# Patient Record
Sex: Female | Born: 1975
Health system: Southern US, Community
[De-identification: ages and names within clinical notes are randomized; demographics above are authoritative.]

## PROBLEM LIST (undated history)

## (undated) DIAGNOSIS — J45909 Unspecified asthma, uncomplicated: Secondary | ICD-10-CM

## (undated) DIAGNOSIS — Z789 Other specified health status: Secondary | ICD-10-CM

## (undated) DIAGNOSIS — Z973 Presence of spectacles and contact lenses: Secondary | ICD-10-CM

## (undated) HISTORY — PX: WISDOM TOOTH EXTRACTION: SHX21

## (undated) HISTORY — PX: APPENDECTOMY: SHX54

## (undated) HISTORY — PX: ABDOMINAL HYSTERECTOMY: SHX81

---

## 2018-10-19 ENCOUNTER — Emergency Department (HOSPITAL_COMMUNITY): Payer: No Typology Code available for payment source

## 2018-10-19 ENCOUNTER — Other Ambulatory Visit: Payer: Self-pay

## 2018-10-19 ENCOUNTER — Inpatient Hospital Stay (HOSPITAL_COMMUNITY)
Admission: EM | Admit: 2018-10-19 | Discharge: 2018-10-23 | DRG: 493 | Disposition: A | Discharge status: Home Health | Payer: No Typology Code available for payment source | Attending: General Surgery | Admitting: General Surgery

## 2018-10-19 ENCOUNTER — Encounter (HOSPITAL_COMMUNITY): Payer: Self-pay | Admitting: Emergency Medicine

## 2018-10-19 DIAGNOSIS — Z90710 Acquired absence of both cervix and uterus: Secondary | ICD-10-CM

## 2018-10-19 DIAGNOSIS — S2241XA Multiple fractures of ribs, right side, initial encounter for closed fracture: Secondary | ICD-10-CM | POA: Diagnosis present

## 2018-10-19 DIAGNOSIS — R509 Fever, unspecified: Secondary | ICD-10-CM | POA: Diagnosis not present

## 2018-10-19 DIAGNOSIS — Y92410 Unspecified street and highway as the place of occurrence of the external cause: Secondary | ICD-10-CM | POA: Diagnosis not present

## 2018-10-19 DIAGNOSIS — R7989 Other specified abnormal findings of blood chemistry: Secondary | ICD-10-CM

## 2018-10-19 DIAGNOSIS — S32119A Unspecified Zone I fracture of sacrum, initial encounter for closed fracture: Secondary | ICD-10-CM | POA: Diagnosis present

## 2018-10-19 DIAGNOSIS — S42111A Displaced fracture of body of scapula, right shoulder, initial encounter for closed fracture: Secondary | ICD-10-CM | POA: Diagnosis present

## 2018-10-19 DIAGNOSIS — S01511A Laceration without foreign body of lip, initial encounter: Secondary | ICD-10-CM | POA: Diagnosis present

## 2018-10-19 DIAGNOSIS — M542 Cervicalgia: Secondary | ICD-10-CM | POA: Diagnosis present

## 2018-10-19 DIAGNOSIS — Z723 Lack of physical exercise: Secondary | ICD-10-CM | POA: Diagnosis not present

## 2018-10-19 DIAGNOSIS — S3210XA Unspecified fracture of sacrum, initial encounter for closed fracture: Secondary | ICD-10-CM

## 2018-10-19 DIAGNOSIS — S82151A Displaced fracture of right tibial tuberosity, initial encounter for closed fracture: Secondary | ICD-10-CM | POA: Diagnosis present

## 2018-10-19 DIAGNOSIS — J45909 Unspecified asthma, uncomplicated: Secondary | ICD-10-CM | POA: Diagnosis present

## 2018-10-19 DIAGNOSIS — D62 Acute posthemorrhagic anemia: Secondary | ICD-10-CM | POA: Diagnosis not present

## 2018-10-19 DIAGNOSIS — S12600A Unspecified displaced fracture of seventh cervical vertebra, initial encounter for closed fracture: Secondary | ICD-10-CM | POA: Diagnosis present

## 2018-10-19 DIAGNOSIS — K8689 Other specified diseases of pancreas: Secondary | ICD-10-CM | POA: Diagnosis present

## 2018-10-19 DIAGNOSIS — Z1159 Encounter for screening for other viral diseases: Secondary | ICD-10-CM

## 2018-10-19 DIAGNOSIS — S82141A Displaced bicondylar fracture of right tibia, initial encounter for closed fracture: Secondary | ICD-10-CM | POA: Diagnosis present

## 2018-10-19 DIAGNOSIS — F17210 Nicotine dependence, cigarettes, uncomplicated: Secondary | ICD-10-CM | POA: Diagnosis present

## 2018-10-19 DIAGNOSIS — S12500A Unspecified displaced fracture of sixth cervical vertebra, initial encounter for closed fracture: Secondary | ICD-10-CM | POA: Diagnosis present

## 2018-10-19 DIAGNOSIS — S32009A Unspecified fracture of unspecified lumbar vertebra, initial encounter for closed fracture: Secondary | ICD-10-CM

## 2018-10-19 DIAGNOSIS — Z419 Encounter for procedure for purposes other than remedying health state, unspecified: Secondary | ICD-10-CM

## 2018-10-19 DIAGNOSIS — S42101A Fracture of unspecified part of scapula, right shoulder, initial encounter for closed fracture: Secondary | ICD-10-CM

## 2018-10-19 DIAGNOSIS — R413 Other amnesia: Secondary | ICD-10-CM | POA: Diagnosis present

## 2018-10-19 DIAGNOSIS — S82142A Displaced bicondylar fracture of left tibia, initial encounter for closed fracture: Secondary | ICD-10-CM

## 2018-10-19 DIAGNOSIS — F10920 Alcohol use, unspecified with intoxication, uncomplicated: Secondary | ICD-10-CM

## 2018-10-19 DIAGNOSIS — F10120 Alcohol abuse with intoxication, uncomplicated: Secondary | ICD-10-CM | POA: Diagnosis present

## 2018-10-19 DIAGNOSIS — S32059A Unspecified fracture of fifth lumbar vertebra, initial encounter for closed fracture: Secondary | ICD-10-CM | POA: Diagnosis present

## 2018-10-19 DIAGNOSIS — S82401A Unspecified fracture of shaft of right fibula, initial encounter for closed fracture: Secondary | ICD-10-CM | POA: Diagnosis present

## 2018-10-19 DIAGNOSIS — S129XXA Fracture of neck, unspecified, initial encounter: Secondary | ICD-10-CM

## 2018-10-19 HISTORY — DX: Other specified health status: Z78.9

## 2018-10-19 HISTORY — DX: Unspecified asthma, uncomplicated: J45.909

## 2018-10-19 LAB — ETHANOL: Alcohol, Ethyl (B): 119 mg/dL — ABNORMAL HIGH (ref <10)

## 2018-10-19 LAB — COMPREHENSIVE METABOLIC PANEL
ALT: 32 U/L (ref 0–44)
AST: 52 U/L — ABNORMAL HIGH (ref 15–41)
Albumin: 4 g/dL (ref 3.5–5.0)
Alkaline Phosphatase: 48 U/L (ref 38–126)
Anion gap: 11 (ref 5–15)
BUN: 9 mg/dL (ref 6–20)
CO2: 20 mmol/L — ABNORMAL LOW (ref 22–32)
Calcium: 8.7 mg/dL — ABNORMAL LOW (ref 8.9–10.3)
Chloride: 105 mmol/L (ref 98–111)
Creatinine, Ser: 0.83 mg/dL (ref 0.44–1.00)
GFR calc Af Amer: >60 mL/min (ref >60)
GFR calc non Af Amer: >60 mL/min (ref >60)
Glucose, Bld: 162 mg/dL — ABNORMAL HIGH (ref 70–99)
Potassium: 3.8 mmol/L (ref 3.5–5.1)
Sodium: 136 mmol/L (ref 135–145)
Total Bilirubin: 0.5 mg/dL (ref 0.3–1.2)
Total Protein: 7.6 g/dL (ref 6.5–8.1)

## 2018-10-19 LAB — I-STAT CHEM 8, ED
BUN: 11 mg/dL (ref 6–20)
Calcium, Ion: 1.05 mmol/L — ABNORMAL LOW (ref 1.15–1.40)
Chloride: 105 mmol/L (ref 98–111)
Creatinine, Ser: 1 mg/dL (ref 0.44–1.00)
Glucose, Bld: 158 mg/dL — ABNORMAL HIGH (ref 70–99)
HCT: 42 % (ref 36.0–46.0)
Hemoglobin: 14.3 g/dL (ref 12.0–15.0)
Potassium: 4.4 mmol/L (ref 3.5–5.1)
Sodium: 137 mmol/L (ref 135–145)
TCO2: 25 mmol/L (ref 22–32)

## 2018-10-19 LAB — URINALYSIS, ROUTINE W REFLEX MICROSCOPIC
Bilirubin Urine: NEGATIVE
Glucose, UA: NEGATIVE mg/dL
Ketones, ur: NEGATIVE mg/dL
Leukocytes,Ua: NEGATIVE
Nitrite: NEGATIVE
Protein, ur: NEGATIVE mg/dL
Specific Gravity, Urine: 1.038 — ABNORMAL HIGH (ref 1.005–1.030)
pH: 6 (ref 5.0–8.0)

## 2018-10-19 LAB — CBC
HCT: 42.4 % (ref 36.0–46.0)
Hemoglobin: 13.6 g/dL (ref 12.0–15.0)
MCH: 28.5 pg (ref 26.0–34.0)
MCHC: 32.1 g/dL (ref 30.0–36.0)
MCV: 88.9 fL (ref 80.0–100.0)
Platelets: 374 10*3/uL (ref 150–400)
RBC: 4.77 MIL/uL (ref 3.87–5.11)
RDW: 13.5 % (ref 11.5–15.5)
WBC: 11.5 10*3/uL — ABNORMAL HIGH (ref 4.0–10.5)
nRBC: 0 % (ref 0.0–0.2)

## 2018-10-19 LAB — PROTIME-INR
INR: 1.1 (ref 0.8–1.2)
Prothrombin Time: 13.6 seconds (ref 11.4–15.2)

## 2018-10-19 LAB — SAMPLE TO BLOOD BANK

## 2018-10-19 LAB — I-STAT BETA HCG BLOOD, ED (MC, WL, AP ONLY): I-stat hCG, quantitative: <5 m[IU]/mL (ref <5)

## 2018-10-19 LAB — LACTIC ACID, PLASMA
Lactic Acid, Venous: 2.7 mmol/L (ref 0.5–1.9)
Lactic Acid, Venous: 1.9 mmol/L (ref 0.5–1.9)

## 2018-10-19 LAB — SARS CORONAVIRUS 2 BY RT PCR (HOSPITAL ORDER, PERFORMED IN ~~LOC~~ HOSPITAL LAB): SARS Coronavirus 2: NEGATIVE

## 2018-10-19 MED ORDER — ONDANSETRON HCL 4 MG/2ML IJ SOLN
INTRAMUSCULAR | Status: AC
  Filled 2018-10-19: qty 2

## 2018-10-19 MED ORDER — ONDANSETRON HCL 4 MG/2ML IJ SOLN: 4.0000 mg | Freq: Four times a day (QID) | INTRAMUSCULAR | Status: DC | PRN

## 2018-10-19 MED ORDER — DOCUSATE SODIUM 100 MG PO CAPS
100.0000 mg | ORAL_CAPSULE | Freq: Two times a day (BID) | ORAL | Status: DC
  Administered 2018-10-19 – 2018-10-23 (×7): 100 mg via ORAL
  Filled 2018-10-19 (×8): qty 1

## 2018-10-19 MED ORDER — TETANUS-DIPHTH-ACELL PERTUSSIS 5-2.5-18.5 LF-MCG/0.5 IM SUSP
0.5000 mL | Freq: Once | INTRAMUSCULAR | Status: AC
  Administered 2018-10-19: 0.5 mL via INTRAMUSCULAR
  Filled 2018-10-19: qty 0.5

## 2018-10-19 MED ORDER — METHOCARBAMOL 1000 MG/10ML IJ SOLN
500.0000 mg | Freq: Four times a day (QID) | INTRAVENOUS | Status: DC | PRN
  Administered 2018-10-20 – 2018-10-21 (×2): 500 mg via INTRAVENOUS
  Filled 2018-10-19: qty 500
  Filled 2018-10-19 (×3): qty 5

## 2018-10-19 MED ORDER — HYDROMORPHONE HCL 1 MG/ML IJ SOLN
INTRAMUSCULAR | Status: AC
  Filled 2018-10-19: qty 1

## 2018-10-19 MED ORDER — HYDROMORPHONE HCL 1 MG/ML IJ SOLN
1.0000 mg | Freq: Once | INTRAMUSCULAR | Status: AC
  Administered 2018-10-19: 1 mg via INTRAVENOUS
  Filled 2018-10-19: qty 1

## 2018-10-19 MED ORDER — BISACODYL 10 MG RE SUPP: 10.0000 mg | Freq: Every day | RECTAL | Status: DC | PRN

## 2018-10-19 MED ORDER — SODIUM CHLORIDE 0.9 % IV SOLN
INTRAVENOUS | Status: DC
  Administered 2018-10-19 – 2018-10-22 (×7): via INTRAVENOUS

## 2018-10-19 MED ORDER — ONDANSETRON HCL 4 MG/2ML IJ SOLN
4.0000 mg | Freq: Once | INTRAMUSCULAR | Status: AC
  Administered 2018-10-19: 4 mg via INTRAVENOUS
  Filled 2018-10-19: qty 2

## 2018-10-19 MED ORDER — MORPHINE SULFATE (PF) 4 MG/ML IV SOLN
4.0000 mg | Freq: Once | INTRAVENOUS | Status: AC
  Administered 2018-10-19: 4 mg via INTRAVENOUS

## 2018-10-19 MED ORDER — OXYCODONE HCL 5 MG PO TABS
5.0000 mg | ORAL_TABLET | ORAL | Status: DC | PRN
  Filled 2018-10-19 (×2): qty 1

## 2018-10-19 MED ORDER — ONDANSETRON 4 MG PO TBDP: 4.0000 mg | ORAL_TABLET | Freq: Four times a day (QID) | ORAL | Status: DC | PRN

## 2018-10-19 MED ORDER — ENOXAPARIN SODIUM 40 MG/0.4ML ~~LOC~~ SOLN
40.0000 mg | SUBCUTANEOUS | Status: DC
  Administered 2018-10-19: 40 mg via SUBCUTANEOUS
  Filled 2018-10-19: qty 0.4

## 2018-10-19 MED ORDER — ONDANSETRON HCL 4 MG/2ML IJ SOLN
4.0000 mg | Freq: Once | INTRAMUSCULAR | Status: AC
  Administered 2018-10-19: 4 mg via INTRAVENOUS

## 2018-10-19 MED ORDER — MORPHINE SULFATE (PF) 4 MG/ML IV SOLN
INTRAVENOUS | Status: AC
  Filled 2018-10-19: qty 1

## 2018-10-19 MED ORDER — HYDROMORPHONE HCL 1 MG/ML IJ SOLN
1.0000 mg | Freq: Once | INTRAMUSCULAR | Status: AC
  Administered 2018-10-19: 1 mg via INTRAVENOUS

## 2018-10-19 MED ORDER — MORPHINE SULFATE (PF) 2 MG/ML IV SOLN
2.0000 mg | INTRAVENOUS | Status: DC | PRN
  Administered 2018-10-19 – 2018-10-22 (×12): 2 mg via INTRAVENOUS
  Filled 2018-10-19 (×14): qty 1

## 2018-10-19 MED ORDER — ACETAMINOPHEN 325 MG PO TABS
650.0000 mg | ORAL_TABLET | ORAL | Status: DC | PRN
  Administered 2018-10-19 – 2018-10-20 (×2): 650 mg via ORAL
  Filled 2018-10-19 (×2): qty 2

## 2018-10-19 MED ORDER — LIDOCAINE-EPINEPHRINE (PF) 2 %-1:200000 IJ SOLN
10.0000 mL | Freq: Once | INTRAMUSCULAR | Status: AC
  Administered 2018-10-19: 10 mL
  Filled 2018-10-19: qty 20

## 2018-10-19 MED ORDER — OXYCODONE HCL 5 MG PO TABS
10.0000 mg | ORAL_TABLET | ORAL | Status: DC | PRN
  Administered 2018-10-19 – 2018-10-21 (×6): 10 mg via ORAL
  Filled 2018-10-19 (×7): qty 2

## 2018-10-19 MED ORDER — IOHEXOL 300 MG/ML  SOLN
100.0000 mL | Freq: Once | INTRAMUSCULAR | Status: AC | PRN
  Administered 2018-10-19: 100 mL via INTRAVENOUS

## 2018-10-19 NOTE — ED Provider Notes (Signed)
..  Laceration Repair  Date/Time: 10/19/2018 8:08 AM Performed by: Renita Papa, PA-C Authorized by: Renita Papa, PA-C   Consent:    Consent obtained:  Verbal   Consent given by:  Patient   Risks discussed:  Infection, need for additional repair, pain, poor cosmetic result and poor wound healing   Alternatives discussed:  No treatment and delayed treatment Universal protocol:    Procedure explained and questions answered to patient or proxy's satisfaction: yes     Relevant documents present and verified: yes     Test results available and properly labeled: yes     Imaging studies available: yes     Required blood products, implants, devices, and special equipment available: yes     Site/side marked: yes     Immediately prior to procedure, a time out was called: yes     Patient identity confirmed:  Verbally with patient and arm band Anesthesia (see MAR for exact dosages):    Anesthesia method:  Local infiltration   Local anesthetic:  Lidocaine 2% WITH epi Laceration details:    Location:  Lip   Lip location:  Lower lip, full thickness   Vermilion border involved: no     Length (cm):  4 (4cm interiorly, 2cm exteriorly)   Depth (mm):  8 Repair type:    Repair type:  Intermediate Pre-procedure details:    Preparation:  Patient was prepped and draped in usual sterile fashion and imaging obtained to evaluate for foreign bodies Exploration:    Hemostasis achieved with:  Direct pressure   Wound exploration: wound explored through full range of motion and entire depth of wound probed and visualized     Wound extent: areolar tissue violated     Contaminated: yes   Treatment:    Area cleansed with:  Betadine, Hibiclens and saline   Amount of cleaning:  Extensive   Irrigation solution:  Sterile water   Irrigation method:  Syringe   Visualized foreign bodies/material removed: yes   Subcutaneous repair:    Suture size:  4-0   Suture material:  Vicryl (vicryl rapide)   Suture  technique:  Simple interrupted   Number of sutures:  2 Mucous membrane repair:    Suture size:  4-0   Suture material:  Vicryl (vicryl rapide)   Number of sutures:  5 Skin repair:    Repair method:  Sutures   Suture size:  5-0   Suture material:  Prolene   Suture technique:  Simple interrupted   Number of sutures:  3 Approximation:    Approximation:  Close   Vermilion border: well-aligned   Post-procedure details:    Dressing:  Open (no dressing)   Patient tolerance of procedure:  Tolerated well, no immediate complications      Debroah Baller 09/32/67 1245    Delora Fuel, MD 80/99/83 [phone]

## 2018-10-19 NOTE — Progress Notes (Signed)
Received patient from ED. Patient alert and oriented. Lower lip intact with sutures, RLE immobilizer in place, C-collar in place. Patient instructed to use call bell should she need anything. Will continue to monitor.
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
## 2018-10-19 NOTE — ED Notes (Signed)
Family out front wanting update

## 2018-10-19 NOTE — ED Notes (Signed)
X-ray at bedside
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
## 2018-10-19 NOTE — ED Notes (Signed)
Family Contacts:  (Husband) Scheryl Marten @ [phone] (Sister) [place] @ [phone] (Sister) [place] @ [phone] (Daughter) [place] @ [phone]
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
## 2018-10-19 NOTE — ED Notes (Signed)
Called and spoke with Husband updated on patient status and room number.
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
## 2018-10-20 LAB — BASIC METABOLIC PANEL
Anion gap: 10 (ref 5–15)
BUN: 6 mg/dL (ref 6–20)
CO2: 23 mmol/L (ref 22–32)
Calcium: 8.3 mg/dL — ABNORMAL LOW (ref 8.9–10.3)
Chloride: 104 mmol/L (ref 98–111)
Creatinine, Ser: 0.69 mg/dL (ref 0.44–1.00)
GFR calc Af Amer: >60 mL/min (ref >60)
GFR calc non Af Amer: >60 mL/min (ref >60)
Glucose, Bld: 122 mg/dL — ABNORMAL HIGH (ref 70–99)
Potassium: 3.4 mmol/L — ABNORMAL LOW (ref 3.5–5.1)
Sodium: 137 mmol/L (ref 135–145)

## 2018-10-20 LAB — CBC
HCT: 35.9 % — ABNORMAL LOW (ref 36.0–46.0)
Hemoglobin: 11.7 g/dL — ABNORMAL LOW (ref 12.0–15.0)
MCH: 27.9 pg (ref 26.0–34.0)
MCHC: 32.6 g/dL (ref 30.0–36.0)
MCV: 85.7 fL (ref 80.0–100.0)
Platelets: 316 10*3/uL (ref 150–400)
RBC: 4.19 MIL/uL (ref 3.87–5.11)
RDW: 13.5 % (ref 11.5–15.5)
WBC: 14.2 10*3/uL — ABNORMAL HIGH (ref 4.0–10.5)
nRBC: 0 % (ref 0.0–0.2)

## 2018-10-20 LAB — HIV ANTIBODY (ROUTINE TESTING W REFLEX): HIV Screen 4th Generation wRfx: NONREACTIVE

## 2018-10-20 NOTE — Progress Notes (Signed)
Ortho Trauma Progress Note  Reviewed imaging per Dr. Stann Mainland request. Significantly comminuted bicondylar tibial plateau fracture. Will attempt ORIF tomorrow pending swelling. Will evaluate patient in the AM. NPO past midnight. Will consult in the AM.  Shona Needles, MD [facility] [phone] (phone) [phone] (office) [email or web address]

## 2018-10-20 NOTE — Plan of Care (Signed)
  Problem: Education: Goal: Knowledge of General Education information will improve Description: Including pain rating scale, medication(s)/side effects and non-pharmacologic comfort measures Outcome: Progressing   Problem: Health Behavior/Discharge Planning: Goal: Ability to manage health-related needs will improve Outcome: Progressing   Problem: Clinical Measurements: Goal: Ability to maintain clinical measurements within normal limits will improve Outcome: Progressing Goal: Will remain free from infection Outcome: Progressing Goal: Cardiovascular complication will be avoided Outcome: Progressing   Problem: Nutrition: Goal: Adequate nutrition will be maintained Outcome: Progressing   Problem: Coping: Goal: Level of anxiety will decrease Outcome: Progressing   Problem: Elimination: Goal: Will not experience complications related to urinary retention Outcome: Progressing   Problem: Pain Managment: Goal: General experience of comfort will improve Outcome: Progressing   Problem: Skin Integrity: Goal: Risk for impaired skin integrity will decrease Outcome: Progressing
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
## 2018-10-20 NOTE — Progress Notes (Signed)
Wasted 10 mg of oxycodone with Netta Neat, RN after patient requested PRN morphine instead.

## 2018-10-21 ENCOUNTER — Inpatient Hospital Stay (HOSPITAL_COMMUNITY): Payer: No Typology Code available for payment source | Admitting: Anesthesiology

## 2018-10-21 ENCOUNTER — Encounter (HOSPITAL_COMMUNITY): Admission: EM | Disposition: A | Discharge status: Home Health | Payer: Self-pay | Source: Home / Self Care

## 2018-10-21 ENCOUNTER — Inpatient Hospital Stay (HOSPITAL_COMMUNITY): Payer: No Typology Code available for payment source

## 2018-10-21 ENCOUNTER — Encounter (HOSPITAL_COMMUNITY): Payer: Self-pay | Admitting: Anesthesiology

## 2018-10-21 HISTORY — PX: ORIF TIBIA PLATEAU: SHX2132

## 2018-10-21 LAB — CBC
HCT: 35.7 % — ABNORMAL LOW (ref 36.0–46.0)
Hemoglobin: 11.7 g/dL — ABNORMAL LOW (ref 12.0–15.0)
MCH: 28 pg (ref 26.0–34.0)
MCHC: 32.8 g/dL (ref 30.0–36.0)
MCV: 85.4 fL (ref 80.0–100.0)
Platelets: 290 10*3/uL (ref 150–400)
RBC: 4.18 MIL/uL (ref 3.87–5.11)
RDW: 13.2 % (ref 11.5–15.5)
WBC: 11.5 10*3/uL — ABNORMAL HIGH (ref 4.0–10.5)
nRBC: 0 % (ref 0.0–0.2)

## 2018-10-21 LAB — BASIC METABOLIC PANEL
Anion gap: 10 (ref 5–15)
BUN: 5 mg/dL — ABNORMAL LOW (ref 6–20)
CO2: 24 mmol/L (ref 22–32)
Calcium: 8.7 mg/dL — ABNORMAL LOW (ref 8.9–10.3)
Chloride: 103 mmol/L (ref 98–111)
Creatinine, Ser: 0.67 mg/dL (ref 0.44–1.00)
GFR calc Af Amer: >60 mL/min (ref >60)
GFR calc non Af Amer: >60 mL/min (ref >60)
Glucose, Bld: 108 mg/dL — ABNORMAL HIGH (ref 70–99)
Potassium: 3.6 mmol/L (ref 3.5–5.1)
Sodium: 137 mmol/L (ref 135–145)

## 2018-10-21 LAB — MRSA PCR SCREENING: MRSA by PCR: NEGATIVE

## 2018-10-21 SURGERY — OPEN REDUCTION INTERNAL FIXATION (ORIF) TIBIAL PLATEAU
Anesthesia: General | Site: Knee | Laterality: Right

## 2018-10-21 MED ORDER — VANCOMYCIN HCL 1000 MG IV SOLR
INTRAVENOUS | Status: DC | PRN
  Administered 2018-10-21: 1000 mg

## 2018-10-21 MED ORDER — OXYCODONE HCL 5 MG PO TABS
5.0000 mg | ORAL_TABLET | ORAL | Status: DC | PRN
  Administered 2018-10-21 – 2018-10-22 (×3): 10 mg via ORAL
  Filled 2018-10-21 (×3): qty 2

## 2018-10-21 MED ORDER — VANCOMYCIN HCL 1000 MG IV SOLR
INTRAVENOUS | Status: AC
  Filled 2018-10-21: qty 1000

## 2018-10-21 MED ORDER — HYDROMORPHONE HCL 1 MG/ML IJ SOLN
INTRAMUSCULAR | Status: AC
  Filled 2018-10-21: qty 0.5

## 2018-10-21 MED ORDER — ONDANSETRON HCL 4 MG/2ML IJ SOLN
INTRAMUSCULAR | Status: DC | PRN
  Administered 2018-10-21: 4 mg via INTRAVENOUS

## 2018-10-21 MED ORDER — ONDANSETRON HCL 4 MG/2ML IJ SOLN: 4.0000 mg | Freq: Once | INTRAMUSCULAR | Status: DC | PRN

## 2018-10-21 MED ORDER — DEXAMETHASONE SODIUM PHOSPHATE 4 MG/ML IJ SOLN
INTRAMUSCULAR | Status: DC | PRN
  Administered 2018-10-21: 10 mg via INTRAVENOUS

## 2018-10-21 MED ORDER — SUCCINYLCHOLINE CHLORIDE 200 MG/10ML IV SOSY
PREFILLED_SYRINGE | INTRAVENOUS | Status: AC
  Filled 2018-10-21: qty 10

## 2018-10-21 MED ORDER — MIDAZOLAM HCL 5 MG/5ML IJ SOLN
INTRAMUSCULAR | Status: DC | PRN
  Administered 2018-10-21: 2 mg via INTRAVENOUS

## 2018-10-21 MED ORDER — FENTANYL CITRATE (PF) 100 MCG/2ML IJ SOLN
INTRAMUSCULAR | Status: DC | PRN
  Administered 2018-10-21 (×2): 50 ug via INTRAVENOUS
  Administered 2018-10-21: 100 ug via INTRAVENOUS
  Administered 2018-10-21: 50 ug via INTRAVENOUS

## 2018-10-21 MED ORDER — GLYCOPYRROLATE 0.2 MG/ML IJ SOLN
INTRAMUSCULAR | Status: DC | PRN
  Administered 2018-10-21: 0.2 mg via INTRAVENOUS

## 2018-10-21 MED ORDER — SUGAMMADEX SODIUM 200 MG/2ML IV SOLN
INTRAVENOUS | Status: DC | PRN
  Administered 2018-10-21: 200 mg via INTRAVENOUS

## 2018-10-21 MED ORDER — 0.9 % SODIUM CHLORIDE (POUR BTL) OPTIME
TOPICAL | Status: DC | PRN
  Administered 2018-10-21: 1000 mL

## 2018-10-21 MED ORDER — FENTANYL CITRATE (PF) 250 MCG/5ML IJ SOLN
INTRAMUSCULAR | Status: AC
  Filled 2018-10-21: qty 5

## 2018-10-21 MED ORDER — OXYCODONE HCL 5 MG/5ML PO SOLN: 5.0000 mg | Freq: Once | ORAL | Status: DC | PRN

## 2018-10-21 MED ORDER — HYDROMORPHONE HCL 1 MG/ML IJ SOLN
0.2500 mg | INTRAMUSCULAR | Status: DC | PRN
  Administered 2018-10-21: 0.5 mg via INTRAVENOUS

## 2018-10-21 MED ORDER — OXYCODONE HCL 5 MG PO TABS: 5.0000 mg | ORAL_TABLET | Freq: Once | ORAL | Status: DC | PRN

## 2018-10-21 MED ORDER — ROCURONIUM BROMIDE 10 MG/ML (PF) SYRINGE
PREFILLED_SYRINGE | INTRAVENOUS | Status: DC | PRN
  Administered 2018-10-21: 50 mg via INTRAVENOUS
  Administered 2018-10-21: 20 mg via INTRAVENOUS

## 2018-10-21 MED ORDER — HYDROMORPHONE HCL 1 MG/ML IJ SOLN
INTRAMUSCULAR | Status: AC
  Filled 2018-10-21: qty 1

## 2018-10-21 MED ORDER — LACTATED RINGERS IV SOLN
INTRAVENOUS | Status: DC
  Administered 2018-10-21 (×2): via INTRAVENOUS

## 2018-10-21 MED ORDER — LIDOCAINE 2% (20 MG/ML) 5 ML SYRINGE
INTRAMUSCULAR | Status: AC
  Filled 2018-10-21: qty 5

## 2018-10-21 MED ORDER — ENOXAPARIN SODIUM 40 MG/0.4ML ~~LOC~~ SOLN
40.0000 mg | SUBCUTANEOUS | Status: DC
  Administered 2018-10-22 – 2018-10-23 (×2): 40 mg via SUBCUTANEOUS
  Filled 2018-10-21 (×2): qty 0.4

## 2018-10-21 MED ORDER — CEFAZOLIN SODIUM-DEXTROSE 2-4 GM/100ML-% IV SOLN
2.0000 g | Freq: Three times a day (TID) | INTRAVENOUS | Status: AC
  Administered 2018-10-21 – 2018-10-22 (×3): 2 g via INTRAVENOUS
  Filled 2018-10-21 (×3): qty 100

## 2018-10-21 MED ORDER — LIDOCAINE 2% (20 MG/ML) 5 ML SYRINGE
INTRAMUSCULAR | Status: DC | PRN
  Administered 2018-10-21: 60 mg via INTRAVENOUS

## 2018-10-21 MED ORDER — PROPOFOL 10 MG/ML IV BOLUS
INTRAVENOUS | Status: DC | PRN
  Administered 2018-10-21: 200 mg via INTRAVENOUS

## 2018-10-21 MED ORDER — ACETAMINOPHEN 325 MG PO TABS
650.0000 mg | ORAL_TABLET | Freq: Four times a day (QID) | ORAL | Status: DC
  Administered 2018-10-21 – 2018-10-23 (×7): 650 mg via ORAL
  Filled 2018-10-21 (×8): qty 2

## 2018-10-21 MED ORDER — PROPOFOL 10 MG/ML IV BOLUS
INTRAVENOUS | Status: AC
  Filled 2018-10-21: qty 20

## 2018-10-21 MED ORDER — DEXMEDETOMIDINE HCL IN NACL 400 MCG/100ML IV SOLN: INTRAVENOUS | Status: DC | PRN

## 2018-10-21 MED ORDER — METHOCARBAMOL 750 MG PO TABS
750.0000 mg | ORAL_TABLET | Freq: Three times a day (TID) | ORAL | Status: DC
  Administered 2018-10-21 – 2018-10-23 (×6): 750 mg via ORAL
  Filled 2018-10-21 (×6): qty 1

## 2018-10-21 MED ORDER — MIDAZOLAM HCL 2 MG/2ML IJ SOLN
INTRAMUSCULAR | Status: AC
  Filled 2018-10-21: qty 2

## 2018-10-21 MED ORDER — SUCCINYLCHOLINE CHLORIDE 20 MG/ML IJ SOLN
INTRAMUSCULAR | Status: DC | PRN
  Administered 2018-10-21: 100 mg via INTRAVENOUS

## 2018-10-21 MED ORDER — DEXMEDETOMIDINE HCL 200 MCG/2ML IV SOLN
INTRAVENOUS | Status: DC | PRN
  Administered 2018-10-21: 8 ug via INTRAVENOUS
  Administered 2018-10-21: 12 ug via INTRAVENOUS
  Administered 2018-10-21: 8 ug via INTRAVENOUS

## 2018-10-21 MED ORDER — CEFAZOLIN SODIUM-DEXTROSE 2-3 GM-%(50ML) IV SOLR
INTRAVENOUS | Status: DC | PRN
  Administered 2018-10-21: 2 g via INTRAVENOUS

## 2018-10-21 MED ORDER — HYDROMORPHONE HCL 1 MG/ML IJ SOLN
INTRAMUSCULAR | Status: DC | PRN
  Administered 2018-10-21: 0.5 mg via INTRAVENOUS

## 2018-10-21 SURGICAL SUPPLY — 90 items
BANDAGE ACE 4X5 VEL STRL LF (GAUZE/BANDAGES/DRESSINGS) ×3 IMPLANT
BANDAGE ACE 6X5 VEL STRL LF (GAUZE/BANDAGES/DRESSINGS) ×3 IMPLANT
BANDAGE ELASTIC 4 VELCRO ST LF (GAUZE/BANDAGES/DRESSINGS) ×2 IMPLANT
BANDAGE ELASTIC 6 VELCRO ST LF (GAUZE/BANDAGES/DRESSINGS) ×2 IMPLANT
BANDAGE ESMARK 6X9 LF (GAUZE/BANDAGES/DRESSINGS) ×1 IMPLANT
BENZOIN TINCTURE PRP APPL 2/3 (GAUZE/BANDAGES/DRESSINGS) ×6 IMPLANT
BIT DRILL 2.5 X LONG (BIT) ×1
BIT DRILL CALIBR QC 2.8X250 (BIT) ×3 IMPLANT
BIT DRILL QC 3.5X110 (BIT) ×3 IMPLANT
BIT DRILL X LONG 2.5 (BIT) ×1 IMPLANT
BLADE CLIPPER SURG (BLADE) IMPLANT
BLADE SURG 15 STRL LF DISP TIS (BLADE) ×1 IMPLANT
BLADE SURG 15 STRL SS (BLADE) ×2
BNDG ESMARK 6X9 LF (GAUZE/BANDAGES/DRESSINGS) ×3
BNDG GAUZE ELAST 4 BULKY (GAUZE/BANDAGES/DRESSINGS) ×3 IMPLANT
BRUSH SCRUB SURG 4.25 DISP (MISCELLANEOUS) ×6 IMPLANT
CANISTER SUCT 3000ML PPV (MISCELLANEOUS) ×3 IMPLANT
CHLORAPREP W/TINT 26 (MISCELLANEOUS) ×6 IMPLANT
CLOSURE STERI-STRIP 1/2X4 (GAUZE/BANDAGES/DRESSINGS) ×3
CLSR STERI-STRIP ANTIMIC 1/2X4 (GAUZE/BANDAGES/DRESSINGS) ×6 IMPLANT
COVER SURGICAL LIGHT HANDLE (MISCELLANEOUS) ×3 IMPLANT
COVER WAND RF STERILE (DRAPES) ×3 IMPLANT
CUFF TOURN SGL QUICK 34 (TOURNIQUET CUFF) ×2
CUFF TRNQT CYL 34X4.125X (TOURNIQUET CUFF) ×1 IMPLANT
DRAPE C-ARM 42X72 X-RAY (DRAPES) ×3 IMPLANT
DRAPE C-ARMOR (DRAPES) ×3 IMPLANT
DRAPE ORTHO SPLIT 77X108 STRL (DRAPES) ×4
DRAPE SURG ORHT 6 SPLT 77X108 (DRAPES) ×2 IMPLANT
DRAPE U-SHAPE 47X51 STRL (DRAPES) ×3 IMPLANT
DRILL BIT X LONG 2.5 (BIT) ×2
DRSG MEPILEX BORDER 4X4 (GAUZE/BANDAGES/DRESSINGS) ×3 IMPLANT
DRSG PAD ABDOMINAL 8X10 ST (GAUZE/BANDAGES/DRESSINGS) ×6 IMPLANT
ELECT REM PT RETURN 9FT ADLT (ELECTROSURGICAL) ×3
ELECTRODE REM PT RTRN 9FT ADLT (ELECTROSURGICAL) ×1 IMPLANT
GAUZE SPONGE 4X4 12PLY STRL (GAUZE/BANDAGES/DRESSINGS) ×3 IMPLANT
GAUZE SPONGE 4X4 12PLY STRL LF (GAUZE/BANDAGES/DRESSINGS) ×3 IMPLANT
GLOVE BIO SURGEON STRL SZ 6.5 (GLOVE) ×10 IMPLANT
GLOVE BIO SURGEON STRL SZ7.5 (GLOVE) ×15 IMPLANT
GLOVE BIO SURGEONS STRL SZ 6.5 (GLOVE) ×5
GLOVE BIOGEL PI IND STRL 6.5 (GLOVE) ×1 IMPLANT
GLOVE BIOGEL PI IND STRL 7.5 (GLOVE) ×1 IMPLANT
GLOVE BIOGEL PI INDICATOR 6.5 (GLOVE) ×2
GLOVE BIOGEL PI INDICATOR 7.5 (GLOVE) ×2
GOWN STRL REUS W/ TWL LRG LVL3 (GOWN DISPOSABLE) ×5 IMPLANT
GOWN STRL REUS W/TWL LRG LVL3 (GOWN DISPOSABLE) ×10
IMMOBILIZER KNEE 22 UNIV (SOFTGOODS) ×3 IMPLANT
KIT BASIN OR (CUSTOM PROCEDURE TRAY) ×3 IMPLANT
KIT TURNOVER KIT B (KITS) ×3 IMPLANT
NDL SUT 6 .5 CRC .975X.05 MAYO (NEEDLE) ×1 IMPLANT
NEEDLE MAYO TAPER (NEEDLE) ×2
NS IRRIG 1000ML POUR BTL (IV SOLUTION) ×3 IMPLANT
PACK TOTAL JOINT (CUSTOM PROCEDURE TRAY) ×3 IMPLANT
PAD ABD 8X10 STRL (GAUZE/BANDAGES/DRESSINGS) ×3 IMPLANT
PAD ARMBOARD 7.5X6 YLW CONV (MISCELLANEOUS) ×6 IMPLANT
PAD CAST 4YDX4 CTTN HI CHSV (CAST SUPPLIES) ×1 IMPLANT
PADDING CAST COTTON 4X4 STRL (CAST SUPPLIES) ×2
PADDING CAST COTTON 6X4 STRL (CAST SUPPLIES) ×3 IMPLANT
PLATE 4H RT 3.5MM MED PROX TIB (Plate) ×3 IMPLANT
PLATE TIBIA VA-LCP 6H RT (Plate) ×3 IMPLANT
SCREW CORTEX 3.5 32MM (Screw) ×4 IMPLANT
SCREW CORTEX 3.5 34MM (Screw) ×2 IMPLANT
SCREW CORTEX 3.5 40MM (Screw) ×2 IMPLANT
SCREW CORTEX 3.5 55MM (Screw) ×6 IMPLANT
SCREW HEADED ST 3.5X55 (Screw) ×6 IMPLANT
SCREW HEADED ST 3.5X70 (Screw) ×6 IMPLANT
SCREW LOCK CORT ST 3.5X32 (Screw) ×2 IMPLANT
SCREW LOCK CORT ST 3.5X34 (Screw) ×1 IMPLANT
SCREW LOCK CORT ST 3.5X40 (Screw) ×1 IMPLANT
SCREW LOCKING 3.5X70MM VA (Screw) ×3 IMPLANT
SCREW LOCKING VA 3.5X50MM (Screw) ×6 IMPLANT
SCREW LOCKING VA 3.5X75MM (Screw) ×6 IMPLANT
STAPLER VISISTAT 35W (STAPLE) ×3 IMPLANT
SUCTION FRAZIER HANDLE 10FR (MISCELLANEOUS) ×2
SUCTION TUBE FRAZIER 10FR DISP (MISCELLANEOUS) ×1 IMPLANT
SUT ETHILON 2 0 FS 18 (SUTURE) ×3 IMPLANT
SUT ETHILON 3 0 PS 1 (SUTURE) IMPLANT
SUT FIBERWIRE #2 38 T-5 BLUE (SUTURE)
SUT MNCRL AB 3-0 PS2 18 (SUTURE) ×6 IMPLANT
SUT VIC AB 0 CT1 27 (SUTURE) ×4
SUT VIC AB 0 CT1 27XBRD ANBCTR (SUTURE) ×2 IMPLANT
SUT VIC AB 1 CT1 18XCR BRD 8 (SUTURE) ×2 IMPLANT
SUT VIC AB 1 CT1 27 (SUTURE) ×2
SUT VIC AB 1 CT1 27XBRD ANBCTR (SUTURE) ×1 IMPLANT
SUT VIC AB 1 CT1 8-18 (SUTURE) ×4
SUT VIC AB 2-0 CT1 27 (SUTURE) ×6
SUT VIC AB 2-0 CT1 TAPERPNT 27 (SUTURE) ×3 IMPLANT
SUTURE FIBERWR #2 38 T-5 BLUE (SUTURE) IMPLANT
TOWEL GREEN STERILE (TOWEL DISPOSABLE) ×6 IMPLANT
TRAY FOLEY MTR SLVR 16FR STAT (SET/KITS/TRAYS/PACK) IMPLANT
WATER STERILE IRR 1000ML POUR (IV SOLUTION) ×6 IMPLANT

## 2018-10-21 NOTE — Anesthesia Postprocedure Evaluation (Signed)
Anesthesia Post Note  Patient: Deneka Greenwalt  Procedure(s) Performed: OPEN REDUCTION INTERNAL FIXATION (ORIF) TIBIAL PLATEAU (Right Knee)     Patient location during evaluation: PACU Anesthesia Type: General Level of consciousness: awake and alert Pain management: pain level controlled Vital Signs Assessment: post-procedure vital signs reviewed and stable Respiratory status: spontaneous breathing, nonlabored ventilation, respiratory function stable and patient connected to nasal cannula oxygen Cardiovascular status: blood pressure returned to baseline and stable Postop Assessment: no apparent nausea or vomiting Anesthetic complications: no    Last Vitals:  Vitals:   10/21/18 1420 10/21/18 1852  BP: (!) 143/87 135/83  Pulse: 90 82  Resp: 18 18  Temp: 36.6 C 37.1 C  SpO2: 93% 99%    Last Pain:  Vitals:   10/21/18 1852  TempSrc: Oral  PainSc:                  Karyl Kinnier Braidan Ricciardi
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
## 2018-10-21 NOTE — Plan of Care (Signed)
  Problem: Clinical Measurements: Goal: Respiratory complications will improve Outcome: Progressing   Problem: Activity: Goal: Risk for activity intolerance will decrease Outcome: Progressing   Problem: Elimination: Goal: Will not experience complications related to bowel motility Outcome: Progressing Goal: Will not experience complications related to urinary retention Outcome: Progressing   Problem: Pain Managment: Goal: General experience of comfort will improve Outcome: Progressing   Problem: Safety: Goal: Ability to remain free from injury will improve Outcome: Progressing   Problem: Skin Integrity: Goal: Risk for impaired skin integrity will decrease Outcome: Progressing

## 2018-10-21 NOTE — Transfer of Care (Signed)
Immediate Anesthesia Transfer of Care Note  Patient: Tina Gallegos  Procedure(s) Performed: OPEN REDUCTION INTERNAL FIXATION (ORIF) TIBIAL PLATEAU (Right Knee)  Patient Location: PACU  Anesthesia Type:General  Level of Consciousness: awake  Airway & Oxygen Therapy: Patient Spontanous Breathing and Patient connected to face mask oxygen  Post-op Assessment: Report given to RN and Post -op Vital signs reviewed and stable  Post vital signs: Reviewed and stable  Last Vitals:  Vitals Value Taken Time  BP 119/72 10/21/18 1323  Temp 36.4 C 10/21/18 1323  Pulse 80 10/21/18 1326  Resp 18 10/21/18 1326  SpO2 100 % 10/21/18 1326  Vitals shown include unvalidated device data.  Last Pain:  Vitals:   10/21/18 1323  TempSrc:   PainSc: (P) Asleep      Patients Stated Pain Goal: 3 (24/09/73 5329)  Complications: No apparent anesthesia complications
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
## 2018-10-21 NOTE — Anesthesia Procedure Notes (Signed)
Procedure Name: Intubation Date/Time: 10/21/2018 10:29 AM Performed by: Lieutenant Diego, CRNA Pre-anesthesia Checklist: Patient identified, Emergency Drugs available, Suction available and Patient being monitored Patient Re-evaluated:Patient Re-evaluated prior to induction Oxygen Delivery Method: Circle system utilized Preoxygenation: Pre-oxygenation with 100% oxygen Induction Type: IV induction Ventilation: Mask ventilation without difficulty Laryngoscope Size: Glidescope (elective) Grade View: Grade I Tube type: Oral Tube size: 7.0 mm Number of attempts: 1 Airway Equipment and Method: Stylet and Oral airway Placement Confirmation: ETT inserted through vocal cords under direct vision,  positive ETCO2 and breath sounds checked- equal and bilateral Secured at: 23 cm Tube secured with: Tape Dental Injury: Teeth and Oropharynx as per pre-operative assessment  Difficulty Due To: Difficult Airway- due to limited oral opening

## 2018-10-21 NOTE — Progress Notes (Signed)
Orthopedic Tech Progress Note Patient Details:  Tina Gallegos October 03, 1975 [id number] Called [employer] for knee brace. Patient ID: Tina Gallegos, female   DOB: 01/31/1976, 43 y.o.   MRN: [id number]   Tina Gallegos T 10/21/2018, 2:22 PM
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
## 2018-10-22 LAB — BASIC METABOLIC PANEL
Anion gap: 10 (ref 5–15)
BUN: 5 mg/dL — ABNORMAL LOW (ref 6–20)
CO2: 23 mmol/L (ref 22–32)
Calcium: 8.6 mg/dL — ABNORMAL LOW (ref 8.9–10.3)
Chloride: 104 mmol/L (ref 98–111)
Creatinine, Ser: 0.69 mg/dL (ref 0.44–1.00)
GFR calc Af Amer: >60 mL/min (ref >60)
GFR calc non Af Amer: >60 mL/min (ref >60)
Glucose, Bld: 119 mg/dL — ABNORMAL HIGH (ref 70–99)
Potassium: 3.6 mmol/L (ref 3.5–5.1)
Sodium: 137 mmol/L (ref 135–145)

## 2018-10-22 LAB — CBC
HCT: 31.2 % — ABNORMAL LOW (ref 36.0–46.0)
Hemoglobin: 10.4 g/dL — ABNORMAL LOW (ref 12.0–15.0)
MCH: 28.3 pg (ref 26.0–34.0)
MCHC: 33.3 g/dL (ref 30.0–36.0)
MCV: 85 fL (ref 80.0–100.0)
Platelets: 308 10*3/uL (ref 150–400)
RBC: 3.67 MIL/uL — ABNORMAL LOW (ref 3.87–5.11)
RDW: 13.2 % (ref 11.5–15.5)
WBC: 15.3 10*3/uL — ABNORMAL HIGH (ref 4.0–10.5)
nRBC: 0 % (ref 0.0–0.2)

## 2018-10-22 LAB — VITAMIN D 25 HYDROXY (VIT D DEFICIENCY, FRACTURES): Vit D, 25-Hydroxy: 16.2 ng/mL — ABNORMAL LOW (ref 30.0–100.0)

## 2018-10-22 MED ORDER — MORPHINE SULFATE (PF) 2 MG/ML IV SOLN
2.0000 mg | INTRAVENOUS | Status: DC | PRN
  Administered 2018-10-22 – 2018-10-23 (×2): 2 mg via INTRAVENOUS
  Filled 2018-10-22 (×2): qty 1

## 2018-10-22 MED ORDER — VITAMIN D 25 MCG (1000 UNIT) PO TABS
2000.0000 [IU] | ORAL_TABLET | Freq: Two times a day (BID) | ORAL | Status: DC
  Administered 2018-10-22 – 2018-10-23 (×3): 2000 [IU] via ORAL
  Filled 2018-10-22 (×3): qty 2

## 2018-10-22 MED ORDER — OXYCODONE HCL 5 MG PO TABS
10.0000 mg | ORAL_TABLET | ORAL | Status: DC | PRN
  Administered 2018-10-22 (×3): 15 mg via ORAL
  Filled 2018-10-22 (×4): qty 3

## 2018-10-22 MED ORDER — WHITE PETROLATUM EX OINT
TOPICAL_OINTMENT | CUTANEOUS | Status: AC
  Administered 2018-10-22: 20:00:00
  Filled 2018-10-22: qty 28.35
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
## 2018-10-22 NOTE — Discharge Instructions (Addendum)
Orthopaedic Trauma Service Discharge Instructions   General Discharge Instructions  WEIGHT BEARING STATUS: Non-weightbearing on right leg  RANGE OF MOTION/ACTIVITY: No restrictions with knee range of motion while in the hinge knee brace. Lock hinge brace into full extension at night  Wound Care: Can leave incisions open to air daily. Leave steri-strips on incisions, they will fall off on their own.   DVT/PE prophylaxis: Aspirin 325 mg daily  Diet: as you were eating previously.  Can use over the counter stool softeners and bowel preparations, such as Miralax, to help with bowel movements.  Narcotics can be constipating.  Be sure to drink plenty of fluids  PAIN MEDICATION USE AND EXPECTATIONS  You have likely been given narcotic medications to help control your pain.  After a traumatic event that results in an fracture (broken bone) with or without surgery, it is ok to use narcotic pain medications to help control one's pain.  We understand that everyone responds to pain differently and each individual patient will be evaluated on a regular basis for the continued need for narcotic medications. Ideally, narcotic medication use should last no more than 6-8 weeks (coinciding with fracture healing).   As a patient it is your responsibility as well to monitor narcotic medication use and report the amount and frequency you use these medications when you come to your office visit.   We would also advise that if you are using narcotic medications, you should take a dose prior to therapy to maximize you participation.  IF YOU ARE ON NARCOTIC MEDICATIONS IT IS NOT PERMISSIBLE TO OPERATE A MOTOR VEHICLE (MOTORCYCLE/CAR/TRUCK/MOPED) OR HEAVY MACHINERY DO NOT MIX NARCOTICS WITH OTHER CNS (CENTRAL NERVOUS SYSTEM) DEPRESSANTS SUCH AS ALCOHOL   STOP SMOKING OR USING NICOTINE PRODUCTS!!!!  As discussed nicotine severely impairs your body's ability to heal surgical and traumatic wounds but also impairs  bone healing.  Wounds and bone heal by forming microscopic blood vessels (angiogenesis) and nicotine is a vasoconstrictor (essentially, shrinks blood vessels).  Therefore, if vasoconstriction occurs to these microscopic blood vessels they essentially disappear and are unable to deliver necessary nutrients to the healing tissue.  This is one modifiable factor that you can do to dramatically increase your chances of healing your injury.    (This means no smoking, no nicotine gum, patches, etc)  DO NOT USE NONSTEROIDAL ANTI-INFLAMMATORY DRUGS (NSAID'S)  Using products such as Advil (ibuprofen), Aleve (naproxen), Motrin (ibuprofen) for additional pain control during fracture healing can delay and/or prevent the healing response.  If you would like to take over the counter (OTC) medication, Tylenol (acetaminophen) is ok.  However, some narcotic medications that are given for pain control contain acetaminophen as well. Therefore, you should not exceed more than 4000 mg of tylenol in a day if you do not have liver disease.  Also note that there are may OTC medicines, such as cold medicines and allergy medicines that my contain tylenol as well.  If you have any questions about medications and/or interactions please ask your doctor/PA or your pharmacist.      ICE AND ELEVATE INJURED/OPERATIVE EXTREMITY  Using ice and elevating the injured extremity above your heart can help with swelling and pain control.  Icing in a pulsatile fashion, such as 20 minutes on and 20 minutes off, can be followed.    Do not place ice directly on skin. Make sure there is a barrier between to skin and the ice pack.    Using frozen items such as frozen  peas works well as the conform nicely to the are that needs to be iced.  USE AN ACE WRAP OR TED HOSE FOR SWELLING CONTROL  In addition to icing and elevation, Ace wraps or TED hose are used to help limit and resolve swelling.  It is recommended to use Ace wraps or TED hose until you are  informed to stop.    When using Ace Wraps start the wrapping distally (farthest away from the body) and wrap proximally (closer to the body)   Example: If you had surgery on your leg or thing and you do not have a splint on, start the ace wrap at the toes and work your way up to the thigh        If you had surgery on your upper extremity and do not have a splint on, start the ace wrap at your fingers and work your way up to the upper arm   CALL THE OFFICE WITH ANY QUESTIONS OR CONCERNS: [phone]   VISIT OUR WEBSITE FOR ADDITIONAL INFORMATION: [email or web address]     Discharge Wound Care Instructions  Do NOT apply any ointments, solutions or lotions to pin sites or surgical wounds.  These prevent needed drainage and even though solutions like hydrogen peroxide kill bacteria, they also damage cells lining the pin sites that help fight infection.  Applying lotions or ointments can keep the wounds moist and can cause them to breakdown and open up as well. This can increase the risk for infection. When in doubt call the office.  Surgical incisions should be dressed daily.  If any drainage is noted, use one layer of adaptic, then gauze, Kerlix, and an ace wrap.  Once the incision is completely dry and without drainage, it may be left open to air out.  Showering may begin 36-48 hours later.  Cleaning gently with soap and water.  Traumatic wounds should be dressed daily as well.    One layer of adaptic, gauze, Kerlix, then ace wrap.  The adaptic can be discontinued once the draining has ceased    If you have a wet to dry dressing: wet the gauze with saline the squeeze as much saline out so the gauze is moist (not soaking wet), place moistened gauze over wound, then place a dry gauze over the moist one, followed by Kerlix wrap, then ace wrap.    Rib Fracture  A rib fracture is a break or crack in one of the bones of the ribs. The ribs are like a cage that goes around your upper chest. A  broken or cracked rib is often painful, but most do not cause other problems. Most rib fractures usually heal on their own in 1-3 months. Follow these instructions at home: Managing pain, stiffness, and swelling  If directed, apply ice to the injured area. ? Put ice in a plastic bag. ? Place a towel between your skin and the bag. ? Leave the ice on for 20 minutes, 2-3 times a day.  Take over-the-counter and prescription medicines only as told by your doctor. Activity  Avoid activities that cause pain to the injured area. Protect your injured area.  Slowly increase activity as told by your doctor. General instructions  Do deep breathing as told by your doctor. You may be told to: ? Take deep breaths many times a day. ? Cough many times a day while hugging a pillow. ? Use a device (incentive spirometer) to do deep breathing many times a day.  Drink enough fluid  to keep your pee (urine) clear or pale yellow.  Do not wear a rib belt or binder. These do not allow you to breathe deeply.  Keep all follow-up visits as told by your doctor. This is important. Contact a doctor if:  You have a fever. Get help right away if:  You have trouble breathing.  You are short of breath.  You cannot stop coughing.  You cough up thick or bloody spit (sputum).  You feel sick to your stomach (nauseous), throw up (vomit), or have belly (abdominal) pain.  Your pain gets worse and medicine does not help. Summary  A rib fracture is a break or crack in one of the bones of the ribs.  Apply ice to the injured area and take medicines for pain as told by your doctor.  Take deep breaths and cough many times a day. Hug a pillow every time you cough. This information is not intended to replace advice given to you by your health care provider. Make sure you discuss any questions you have with your health care provider. Document Released: 01/11/2008 Document Revised: 03/16/2017 Document Reviewed:  07/04/2016 Elsevier Patient Education  2020 [employer]  You should contact your insurance and find out what primary care providers in your area are covered. You need to establish care with someone both to assist with acute management but also for surveillance of possible sarcoid seen on chest CT and dilated pancreatic duct.

## 2018-10-23 ENCOUNTER — Encounter (HOSPITAL_COMMUNITY): Payer: Self-pay | Admitting: Student

## 2018-10-23 LAB — CBC
HCT: 29.3 % — ABNORMAL LOW (ref 36.0–46.0)
Hemoglobin: 9.7 g/dL — ABNORMAL LOW (ref 12.0–15.0)
MCH: 28.1 pg (ref 26.0–34.0)
MCHC: 33.1 g/dL (ref 30.0–36.0)
MCV: 84.9 fL (ref 80.0–100.0)
Platelets: 335 10*3/uL (ref 150–400)
RBC: 3.45 MIL/uL — ABNORMAL LOW (ref 3.87–5.11)
RDW: 13.4 % (ref 11.5–15.5)
WBC: 10.7 10*3/uL — ABNORMAL HIGH (ref 4.0–10.5)
nRBC: 0 % (ref 0.0–0.2)

## 2018-10-23 LAB — BASIC METABOLIC PANEL
Anion gap: 10 (ref 5–15)
BUN: 6 mg/dL (ref 6–20)
CO2: 25 mmol/L (ref 22–32)
Calcium: 8.2 mg/dL — ABNORMAL LOW (ref 8.9–10.3)
Chloride: 101 mmol/L (ref 98–111)
Creatinine, Ser: 0.69 mg/dL (ref 0.44–1.00)
GFR calc Af Amer: >60 mL/min (ref >60)
GFR calc non Af Amer: >60 mL/min (ref >60)
Glucose, Bld: 112 mg/dL — ABNORMAL HIGH (ref 70–99)
Potassium: 3 mmol/L — ABNORMAL LOW (ref 3.5–5.1)
Sodium: 136 mmol/L (ref 135–145)

## 2018-10-23 MED ORDER — VITAMIN D 25 MCG (1000 UNIT) PO TABS: 2000.0000 [IU] | ORAL_TABLET | Freq: Two times a day (BID) | ORAL | 1 refills | Status: DC

## 2018-10-23 MED ORDER — DOCUSATE SODIUM 100 MG PO CAPS: 100.0000 mg | ORAL_CAPSULE | Freq: Two times a day (BID) | ORAL | Status: DC

## 2018-10-23 MED ORDER — OXYCODONE HCL 10 MG PO TABS: 10.0000 mg | ORAL_TABLET | ORAL | 0 refills | Status: DC | PRN

## 2018-10-23 MED ORDER — TRAMADOL HCL 50 MG PO TABS
50.0000 mg | ORAL_TABLET | Freq: Four times a day (QID) | ORAL | Status: DC
  Administered 2018-10-23 (×2): 50 mg via ORAL
  Filled 2018-10-23 (×2): qty 1

## 2018-10-23 MED ORDER — ASPIRIN EC 325 MG PO TBEC: 325.0000 mg | DELAYED_RELEASE_TABLET | Freq: Every day | ORAL | 0 refills | Status: AC

## 2018-10-23 MED ORDER — ACETAMINOPHEN 500 MG PO TABS: 1000.0000 mg | ORAL_TABLET | Freq: Four times a day (QID) | ORAL | 0 refills | Status: DC | PRN

## 2018-10-23 MED ORDER — BISACODYL 10 MG RE SUPP: 10.0000 mg | Freq: Every day | RECTAL | 0 refills | Status: DC | PRN

## 2018-10-23 MED ORDER — ACETAMINOPHEN 500 MG PO TABS
1000.0000 mg | ORAL_TABLET | Freq: Four times a day (QID) | ORAL | Status: DC
  Administered 2018-10-23 (×2): 1000 mg via ORAL
  Filled 2018-10-23 (×2): qty 2

## 2018-10-23 MED ORDER — METHOCARBAMOL 750 MG PO TABS: 750.0000 mg | ORAL_TABLET | Freq: Three times a day (TID) | ORAL | 0 refills | Status: DC

## 2018-10-23 MED ORDER — POTASSIUM CHLORIDE CRYS ER 20 MEQ PO TBCR
40.0000 meq | EXTENDED_RELEASE_TABLET | Freq: Once | ORAL | Status: AC
  Administered 2018-10-23: 40 meq via ORAL
  Filled 2018-10-23: qty 2

## 2018-10-23 MED ORDER — TRAMADOL HCL 50 MG PO TABS: 50.0000 mg | ORAL_TABLET | Freq: Four times a day (QID) | ORAL | 0 refills | Status: DC

## 2018-10-23 MED FILL — ASPIRIN EC 325 MG TABLET: 325 | 60 days supply | Qty: 60 | Fill #0

## 2018-10-23 MED FILL — VITAMIN D3 1,000 UNIT TAB: 25 MCG | 30 days supply | Qty: 120 | Fill #0

## 2018-10-23 MED FILL — traMADol HCL 50 MG TABS: 50 | 7 days supply | Qty: 30 | Fill #0

## 2018-10-23 MED FILL — oxyCODONE HCL 10 MG TABS: 10 | 5 days supply | Qty: 45 | Fill #0

## 2018-10-23 MED FILL — METHOCARBAMOL 750 MG TABS: 750 | 20 days supply | Qty: 60 | Fill #0

## 2018-10-23 NOTE — Progress Notes (Signed)
Chaplain responded to spiritual consult. Patient may be interested in Advanced Directive.  Patient not familiar with AD when chaplain visited.  Chaplain explained the two parts and left forms with patient for further consideration.  Chaplain will be available if patient wishes to complete and sign forms. Rev. Tamsen Snider Pager [phone]
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
## 2018-10-23 NOTE — Progress Notes (Signed)
CSW met with patient to complete SBIRT- patient voiced feeling excited to get home. Patient voiced no current concerns with any alcohol or substance abuse at this time. Patient did state she likes to drink on occasion however did not think this was an issue. CSW voiced understanding and encouraged patient to reach out to [facility] department/ PCP with any questions or concerns.   Kingsley Spittle, LCSW [facility]  [phone]
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
## 2018-10-23 NOTE — Progress Notes (Signed)
Patient suffers from a tibial plateau fracture which impairs their ability to perform daily activities like bathing, dressing, feeding and grooming in the home.  A walker will not resolve issue with performing activities of daily living. A wheelchair will allow patient to safely perform daily activities. Patient can safely propel the wheelchair in the home or has a caregiver who can provide assistance. Length of need 6 months . Accessories: elevating leg rests (ELRs), wheel locks, extensions and anti-tippers.  Brigid Re , [facility] 10/23/2018, 7:35 AM Pager: [phone]
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
## 2018-10-23 NOTE — Progress Notes (Signed)
Discharged Pt to home. Instructions given and explained. Belongings returned accordingly. 

## 2018-10-30 ENCOUNTER — Encounter: Payer: Self-pay | Admitting: Student

## 2018-10-30 DIAGNOSIS — S329XXA Fracture of unspecified parts of lumbosacral spine and pelvis, initial encounter for closed fracture: Secondary | ICD-10-CM | POA: Insufficient documentation

## 2018-10-30 DIAGNOSIS — S2249XA Multiple fractures of ribs, unspecified side, initial encounter for closed fracture: Secondary | ICD-10-CM | POA: Insufficient documentation

## 2018-10-30 DIAGNOSIS — S82141A Displaced bicondylar fracture of right tibia, initial encounter for closed fracture: Secondary | ICD-10-CM | POA: Insufficient documentation

## 2018-10-30 DIAGNOSIS — S82151A Displaced fracture of right tibial tuberosity, initial encounter for closed fracture: Secondary | ICD-10-CM | POA: Insufficient documentation

## 2018-10-30 DIAGNOSIS — S12500A Unspecified displaced fracture of sixth cervical vertebra, initial encounter for closed fracture: Secondary | ICD-10-CM | POA: Insufficient documentation

## 2018-10-30 DIAGNOSIS — S42101A Fracture of unspecified part of scapula, right shoulder, initial encounter for closed fracture: Secondary | ICD-10-CM | POA: Insufficient documentation

## 2018-12-24 ENCOUNTER — Other Ambulatory Visit (HOSPITAL_COMMUNITY)
Admission: RE | Admit: 2018-12-24 | Discharge: 2018-12-24 | Disposition: A | Discharge status: Home / Self Care | Payer: HRSA Program | Source: Ambulatory Visit | Attending: Student | Admitting: Student

## 2018-12-24 ENCOUNTER — Ambulatory Visit: Payer: Self-pay | Admitting: Student

## 2018-12-24 DIAGNOSIS — S82141A Displaced bicondylar fracture of right tibia, initial encounter for closed fracture: Secondary | ICD-10-CM

## 2018-12-24 DIAGNOSIS — Z20828 Contact with and (suspected) exposure to other viral communicable diseases: Secondary | ICD-10-CM | POA: Diagnosis present

## 2018-12-24 DIAGNOSIS — Z01812 Encounter for preprocedural laboratory examination: Secondary | ICD-10-CM | POA: Insufficient documentation

## 2018-12-25 ENCOUNTER — Other Ambulatory Visit: Payer: Self-pay

## 2018-12-25 ENCOUNTER — Encounter (HOSPITAL_COMMUNITY): Payer: Self-pay | Admitting: *Deleted

## 2018-12-25 LAB — NOVEL CORONAVIRUS, NAA (HOSP ORDER, SEND-OUT TO REF LAB; TAT 18-24 HRS): SARS-CoV-2, NAA: NOT DETECTED

## 2018-12-25 NOTE — Progress Notes (Signed)
Pt denies SOB, chest pain, and being under the care of a cardiologist. PT denies having a PCP. Pt denies having a stress test, echo and cardiac cath. Pt denies having a chest x ray in the last year.  Pt denies recent labs. Pt made aware to stop taking  Aspirin (unless otherwise advised by surgeon), vitamins, fish oil and herbal medications. Do not take any NSAIDs ie: Ibuprofen, Advil, Naproxen (Aleve), Motrin, BC and Goody Powder. Pt verbalized understanding of all pre-op instructions.

## 2018-12-26 NOTE — H&P (Signed)
Orthopaedic Trauma Service (OTS) H&P  Patient ID: Tina Gallegos MRN: [id number] DOB/AGE: Sep 27, 1975 43 y.o.  Reason for Surgery: Closed manipulation of right knee   HPI: Tina CoppMonica Gallegos is an 43 y.o. female presenting for surgery. Patient underwent ORIF of right bicondylar tibial plateau fracture 10/21/2018. She has gone on to heal her fracture over the last 2 months but has moved her knee very little in the meantime. Due to lack of insurance has not done any formal physical therapy. She has been provided range of motion exercises to perform on her own at home.  She has been ambulating some at home with the use of a walker but has remained non-weightbearing on the right leg. Continues to have pain in the knee with motion. She denies numbness or tingling in the extremity. Denies issues with her incision. She has 70-75 degrees of knee flexion and has a few degrees of flexion contracture. She would like to move forward with manipulation of the knee at this point.  Past Medical History:  Diagnosis Date  . Asthma   . Known health problems: none   . Wears glasses     Past Surgical History:  Procedure Laterality Date  . ABDOMINAL HYSTERECTOMY    . APPENDECTOMY    . ORIF TIBIA PLATEAU Right 10/21/2018   Procedure: OPEN REDUCTION INTERNAL FIXATION (ORIF) TIBIAL PLATEAU;  Surgeon: Roby LoftsHaddix, Kevin P, MD;  Location: [facility] OR;  Service: Orthopedics;  Laterality: Right;  . WISDOM TOOTH EXTRACTION      Family History  Problem Relation Age of Onset  . Hypertension Mother     Social History:  reports that she has been smoking cigars. She has never used smokeless tobacco. She reports current alcohol use. She reports current drug use. Drug: Marijuana.  Allergies:  Allergies  Allergen Reactions  . Percocet [Oxycodone-Acetaminophen] Hives and Rash    Broke out in vaginal area and nails. Pt tolerates just plain oxycodone    Medications: Current Meds  Medication Sig  . acetaminophen (TYLENOL) 500 MG  tablet Take 2 tablets (1,000 mg total) by mouth every 6 (six) hours as needed for mild pain or fever.     ROS: Constitutional: No fever or chills Vision: No changes in vision ENT: No difficulty swallowing CV: No chest pain Pulm: No SOB or wheezing GI: No nausea or vomiting GU: No urgency or inability to hold urine Skin: No poor wound healing Neurologic: No numbness or tingling Psychiatric: No depression or anxiety Heme: No bruising Allergic: No reaction to medications or food   Exam: There were no vitals taken for this visit. General: Sitting up, NAD Orientation: Alert and oriented x 3 Mood and Affect: Mood and affect appropriate, pleasant and cooperative Gait: Ambulates with walker, unable to WB on RLE Coordination and balance: Within normal limits  Right Lower Extremity: Well healed incision. Minimally tender with palpation of knee. About 5 degree flexion contracture. Active knee flexion about 70 degrees. With some manipulation in clinic, able to get about 75 degrees of flexion. motor and sensory function intact distally. 2+ DP pulse   Left Lower Extremity: Skin without lesions. No tenderness to palpation. Full painless ROM, full strength in each muscle group without evidence of instability.   Medical Decision Making: Data: Imaging: AP and lateral views of right knee and tibia/fibula shows plate in screws in excellent position without signs of hardware failure or loosening. Fracture appears to be almost fully healed  Labs: No results found for this or any previous visit (from the  past 24 hour(s)).   Medical history and chart was reviewed and case discussed with medical provider.  Assessment/Plan: 43 year old female presenting for closed manipulation of right knee s/p ORIF right tibial plateau fracture 10/21/2018.  At this juncture, I do not feel that her motion will improve much more without a formal manipulation of the knee under anesthesia. I would recommend proceeding  with the planned procedure. I discussed risks and benefits of procedure with the patient. Risks discussed included continued pain, stiffness, post-traumatic arthritis, and even anesthesia complications. She agrees to proceed with surgery. Questions answered, consent obtained.    Saketh Daubert A. Carmie Kanner [facility] ?[phone]? (phone)

## 2018-12-27 ENCOUNTER — Encounter (HOSPITAL_COMMUNITY)
Admission: RE | Disposition: A | Discharge status: Home / Self Care | Payer: Self-pay | Source: Home / Self Care | Attending: Student

## 2018-12-27 ENCOUNTER — Other Ambulatory Visit: Payer: Self-pay

## 2018-12-27 ENCOUNTER — Ambulatory Visit (HOSPITAL_COMMUNITY): Payer: Self-pay

## 2018-12-27 ENCOUNTER — Ambulatory Visit (HOSPITAL_COMMUNITY): Payer: Self-pay | Admitting: Certified Registered Nurse Anesthetist

## 2018-12-27 ENCOUNTER — Encounter (HOSPITAL_COMMUNITY): Payer: Self-pay

## 2018-12-27 ENCOUNTER — Ambulatory Visit (HOSPITAL_COMMUNITY)
Admission: RE | Admit: 2018-12-27 | Discharge: 2018-12-27 | Disposition: A | Discharge status: Home / Self Care | Payer: Self-pay | Attending: Student | Admitting: Student

## 2018-12-27 DIAGNOSIS — F17290 Nicotine dependence, other tobacco product, uncomplicated: Secondary | ICD-10-CM | POA: Insufficient documentation

## 2018-12-27 DIAGNOSIS — Z419 Encounter for procedure for purposes other than remedying health state, unspecified: Secondary | ICD-10-CM

## 2018-12-27 DIAGNOSIS — M24661 Ankylosis, right knee: Secondary | ICD-10-CM | POA: Insufficient documentation

## 2018-12-27 DIAGNOSIS — S82142A Displaced bicondylar fracture of left tibia, initial encounter for closed fracture: Secondary | ICD-10-CM

## 2018-12-27 DIAGNOSIS — S82141A Displaced bicondylar fracture of right tibia, initial encounter for closed fracture: Secondary | ICD-10-CM

## 2018-12-27 HISTORY — DX: Presence of spectacles and contact lenses: Z97.3

## 2018-12-27 HISTORY — PX: KNEE CLOSED REDUCTION: SHX995

## 2018-12-27 LAB — HEMOGLOBIN: Hemoglobin: 13.4 g/dL (ref 12.0–15.0)

## 2018-12-27 SURGERY — MANIPULATION, KNEE, CLOSED
Anesthesia: General | Site: Knee | Laterality: Right

## 2018-12-27 MED ORDER — LIDOCAINE 2% (20 MG/ML) 5 ML SYRINGE
INTRAMUSCULAR | Status: AC
  Filled 2018-12-27: qty 5

## 2018-12-27 MED ORDER — EPHEDRINE 5 MG/ML INJ
INTRAVENOUS | Status: AC
  Filled 2018-12-27: qty 10

## 2018-12-27 MED ORDER — MIDAZOLAM HCL 5 MG/5ML IJ SOLN
INTRAMUSCULAR | Status: DC | PRN
  Administered 2018-12-27: 2 mg via INTRAVENOUS

## 2018-12-27 MED ORDER — HYDROMORPHONE HCL 1 MG/ML IJ SOLN
0.2500 mg | INTRAMUSCULAR | Status: DC | PRN
  Administered 2018-12-27: 1 mg via INTRAVENOUS
  Administered 2018-12-27 (×2): 0.5 mg via INTRAVENOUS

## 2018-12-27 MED ORDER — HYDROCODONE-ACETAMINOPHEN 5-325 MG PO TABS
ORAL_TABLET | ORAL | Status: AC
  Filled 2018-12-27: qty 1

## 2018-12-27 MED ORDER — PROPOFOL 10 MG/ML IV BOLUS
INTRAVENOUS | Status: DC | PRN
  Administered 2018-12-27: 200 mg via INTRAVENOUS

## 2018-12-27 MED ORDER — LACTATED RINGERS IV SOLN
INTRAVENOUS | Status: DC | PRN
  Administered 2018-12-27: 07:00:00 via INTRAVENOUS

## 2018-12-27 MED ORDER — HYDROCODONE-ACETAMINOPHEN 5-325 MG PO TABS: 1.0000 | ORAL_TABLET | Freq: Four times a day (QID) | ORAL | 0 refills | Status: AC | PRN

## 2018-12-27 MED ORDER — LIDOCAINE 2% (20 MG/ML) 5 ML SYRINGE
INTRAMUSCULAR | Status: DC | PRN
  Administered 2018-12-27: 80 mg via INTRAVENOUS

## 2018-12-27 MED ORDER — HYDROCODONE-ACETAMINOPHEN 5-325 MG PO TABS
1.0000 | ORAL_TABLET | ORAL | Status: AC
  Administered 2018-12-27: 1 via ORAL

## 2018-12-27 MED ORDER — PROPOFOL 10 MG/ML IV BOLUS
INTRAVENOUS | Status: AC
  Filled 2018-12-27: qty 20

## 2018-12-27 MED ORDER — MIDAZOLAM HCL 2 MG/2ML IJ SOLN
INTRAMUSCULAR | Status: AC
  Filled 2018-12-27: qty 2

## 2018-12-27 MED ORDER — ONDANSETRON HCL 4 MG/2ML IJ SOLN
INTRAMUSCULAR | Status: DC | PRN
  Administered 2018-12-27: 4 mg via INTRAVENOUS

## 2018-12-27 MED ORDER — PHENYLEPHRINE 40 MCG/ML (10ML) SYRINGE FOR IV PUSH (FOR BLOOD PRESSURE SUPPORT)
PREFILLED_SYRINGE | INTRAVENOUS | Status: AC
  Filled 2018-12-27: qty 10

## 2018-12-27 MED ORDER — CHLORHEXIDINE GLUCONATE 4 % EX LIQD: 60.0000 mL | Freq: Once | CUTANEOUS | Status: DC

## 2018-12-27 MED ORDER — FENTANYL CITRATE (PF) 250 MCG/5ML IJ SOLN
INTRAMUSCULAR | Status: AC
  Filled 2018-12-27: qty 5

## 2018-12-27 MED ORDER — HYDROMORPHONE HCL 1 MG/ML IJ SOLN
INTRAMUSCULAR | Status: AC
  Filled 2018-12-27: qty 1

## 2018-12-27 MED ORDER — FENTANYL CITRATE (PF) 250 MCG/5ML IJ SOLN
INTRAMUSCULAR | Status: DC | PRN
  Administered 2018-12-27: 50 ug via INTRAVENOUS

## 2018-12-27 MED ORDER — PROMETHAZINE HCL 25 MG/ML IJ SOLN: 6.2500 mg | INTRAMUSCULAR | Status: DC | PRN

## 2018-12-27 NOTE — Transfer of Care (Signed)
Immediate Anesthesia Transfer of Care Note  Patient: Tina Gallegos  Procedure(s) Performed: CLOSED MANIPULATION KNEE (Right Knee)  Patient Location: PACU  Anesthesia Type:General  Level of Consciousness: awake, alert  and oriented  Airway & Oxygen Therapy: Patient Spontanous Breathing and Patient connected to face mask oxygen  Post-op Assessment: Report given to RN and Post -op Vital signs reviewed and stable  Post vital signs: Reviewed and stable  Last Vitals:  Vitals Value Taken Time  BP 126/83 12/27/18 0752  Temp    Pulse 67 12/27/18 0753  Resp 39 12/27/18 0753  SpO2 100 % 12/27/18 0753  Vitals shown include unvalidated device data.  Last Pain:  Vitals:   12/27/18 0640  TempSrc: Oral  PainSc:       Patients Stated Pain Goal: 3 (01/77/93 9030)  Complications: No apparent anesthesia complications

## 2018-12-27 NOTE — Anesthesia Postprocedure Evaluation (Signed)
Anesthesia Post Note  Patient: Tina Gallegos  Procedure(s) Performed: CLOSED MANIPULATION KNEE (Right Knee)     Patient location during evaluation: PACU Anesthesia Type: General Level of consciousness: awake and alert Pain management: pain level controlled Vital Signs Assessment: post-procedure vital signs reviewed and stable Respiratory status: spontaneous breathing, nonlabored ventilation and respiratory function stable Cardiovascular status: blood pressure returned to baseline and stable Postop Assessment: no apparent nausea or vomiting Anesthetic complications: no    Last Vitals:  Vitals:   12/27/18 0815 12/27/18 0826  BP: 132/88   Pulse: 76 73  Resp: (!) 22 17  Temp:    SpO2: 93% 98%    Last Pain:  Vitals:   12/27/18 0900  TempSrc:   PainSc: Asleep                 Lynda Rainwater

## 2018-12-27 NOTE — Interval H&P Note (Signed)
History and Physical Interval Note:  12/27/2018 7:24 AM  Tina Gallegos  has presented today for surgery, with the diagnosis of Knee stiffness.  The various methods of treatment have been discussed with the patient and family. After consideration of risks, benefits and other options for treatment, the patient has consented to  Procedure(s): CLOSED MANIPULATION KNEE (Right) as a surgical intervention.  The patient's history has been reviewed, patient examined, no change in status, stable for surgery.  I have reviewed the patient's chart and labs.  Questions were answered to the patient's satisfaction.     Lennette Bihari P Rhyse Skowron
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
## 2018-12-27 NOTE — Anesthesia Preprocedure Evaluation (Signed)
Anesthesia Evaluation  Patient identified by MRN, date of birth, ID band Patient awake    Reviewed: Allergy & Precautions, NPO status , Patient's Chart, lab work & pertinent test results  History of Anesthesia Complications Negative for: history of anesthetic complications  Airway Mallampati: III  TM Distance: >3 FB Neck ROM: Full  Mouth opening: Limited Mouth Opening Comment: Limited mouth opening d/t pain from soft tissue injury to lip Dental no notable dental hx. (+) Teeth Intact   Pulmonary asthma , Current Smoker,    Pulmonary exam normal breath sounds clear to auscultation       Cardiovascular negative cardio ROS Normal cardiovascular exam Rhythm:Regular Rate:Normal     Neuro/Psych negative psych ROS   GI/Hepatic negative GI ROS, Neg liver ROS,   Endo/Other  negative endocrine ROS  Renal/GU negative Renal ROS  negative genitourinary   Musculoskeletal negative musculoskeletal ROS (+)   Abdominal   Peds  Hematology negative hematology ROS (+)   Anesthesia Other Findings   Reproductive/Obstetrics                             Anesthesia Physical  Anesthesia Plan  ASA: II  Anesthesia Plan: General   Post-op Pain Management:    Induction: Intravenous  PONV Risk Score and Plan: 2 and Ondansetron, Midazolam and Treatment may vary due to age or medical condition  Airway Management Planned: Mask  Additional Equipment: None  Intra-op Plan:   Post-operative Plan:   Informed Consent: I have reviewed the patients History and Physical, chart, labs and discussed the procedure including the risks, benefits and alternatives for the proposed anesthesia with the patient or authorized representative who has indicated his/her understanding and acceptance.     Dental advisory given  Plan Discussed with: CRNA and Anesthesiologist  Anesthesia Plan Comments:         Anesthesia  Quick Evaluation

## 2018-12-27 NOTE — Discharge Instructions (Signed)
Orth[facility]edic Trauma Service Discharge Instructions   General Discharge Instructions  WEIGHT BEARING STATUS: Weightbearing as tolerated right leg  RANGE OF MOTION/ACTIVITY: Unrestricted knee motion  DVT/PE prophylaxis: None  Diet: as you were eating previously.  Can use over the counter stool softeners and bowel preparations, such as Miralax, to help with bowel movements.  Narcotics can be constipating.  Be sure to drink plenty of fluids  PAIN MEDICATION USE AND EXPECTATIONS  You have likely been given narcotic medications to help control your pain.  After a traumatic event that results in an fracture (broken bone) with or without surgery, it is ok to use narcotic pain medications to help control one's pain.  We understand that everyone responds to pain differently and each individual patient will be evaluated on a regular basis for the continued need for narcotic medications. Ideally, narcotic medication use should last no more than 6-8 weeks (coinciding with fracture healing).   As a patient it is your responsibility as well to monitor narcotic medication use and report the amount and frequency you use these medications when you come to your office visit.   We would also advise that if you are using narcotic medications, you should take a dose prior to therapy to maximize you participation.  IF YOU ARE ON NARCOTIC MEDICATIONS IT IS NOT PERMISSIBLE TO OPERATE A MOTOR VEHICLE (MOTORCYCLE/CAR/TRUCK/MOPED) OR HEAVY MACHINERY DO NOT MIX NARCOTICS WITH OTHER CNS (CENTRAL NERVOUS SYSTEM) DEPRESSANTS SUCH AS ALCOHOL   STOP SMOKING OR USING NICOTINE PRODUCTS!!!!  As discussed nicotine severely impairs your body's ability to heal surgical and traumatic wounds but also impairs bone healing.  Wounds and bone heal by forming microscopic blood vessels (angiogenesis) and nicotine is a vasoconstrictor (essentially, shrinks blood vessels).  Therefore, if vasoconstriction occurs to these microscopic blood  vessels they essentially disappear and are unable to deliver necessary nutrients to the healing tissue.  This is one modifiable factor that you can do to dramatically increase your chances of healing your injury.    (This means no smoking, no nicotine gum, patches, etc)     ICE AND ELEVATE INJURED/OPERATIVE EXTREMITY  Using ice and elevating the injured extremity above your heart can help with swelling and pain control.  Icing in a pulsatile fashion, such as 20 minutes on and 20 minutes off, can be followed.    Do not place ice directly on skin. Make sure there is a barrier between to skin and the ice pack.    Using frozen items such as frozen peas works well as the conform nicely to the are that needs to be iced.  USE AN ACE WRAP OR TED HOSE FOR SWELLING CONTROL  In addition to icing and elevation, Ace wraps or TED hose are used to help limit and resolve swelling.  It is recommended to use Ace wraps or TED hose until you are informed to stop.    When using Ace Wraps start the wrapping distally (farthest away from the body) and wrap proximally (closer to the body)   Example: If you had surgery on your leg or thing and you do not have a splint on, start the ace wrap at the toes and work your way up to the thigh        If you had surgery on your upper extremity and do not have a splint on, start the ace wrap at your fingers and work your way up to the upper arm    CALL THE OFFICE WITH ANY  QUESTIONS OR CONCERNS: [phone]   VISIT OUR WEBSITE FOR ADDITIONAL INFORMATION: [email or web address]     If you do not hear from outpatient physical therapy by Wednesday of next week, give them a call.   [address] [place], [place] [address] [phone]

## 2018-12-28 ENCOUNTER — Encounter (HOSPITAL_COMMUNITY): Payer: Self-pay | Admitting: Student

## 2018-12-30 ENCOUNTER — Ambulatory Visit: Payer: Self-pay | Attending: Student | Admitting: Physical Therapy

## 2018-12-30 ENCOUNTER — Other Ambulatory Visit: Payer: Self-pay

## 2018-12-30 ENCOUNTER — Encounter: Payer: Self-pay | Admitting: Physical Therapy

## 2018-12-30 DIAGNOSIS — M25661 Stiffness of right knee, not elsewhere classified: Secondary | ICD-10-CM | POA: Insufficient documentation

## 2018-12-30 DIAGNOSIS — M6281 Muscle weakness (generalized): Secondary | ICD-10-CM | POA: Insufficient documentation

## 2018-12-30 DIAGNOSIS — M25561 Pain in right knee: Secondary | ICD-10-CM | POA: Insufficient documentation

## 2018-12-30 DIAGNOSIS — R262 Difficulty in walking, not elsewhere classified: Secondary | ICD-10-CM | POA: Insufficient documentation
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
## 2018-12-31 ENCOUNTER — Other Ambulatory Visit: Payer: Self-pay

## 2018-12-31 ENCOUNTER — Ambulatory Visit: Payer: Self-pay | Admitting: Physical Therapy

## 2018-12-31 ENCOUNTER — Encounter: Payer: Self-pay | Admitting: Physical Therapy

## 2018-12-31 DIAGNOSIS — M25661 Stiffness of right knee, not elsewhere classified: Secondary | ICD-10-CM

## 2018-12-31 DIAGNOSIS — M25561 Pain in right knee: Secondary | ICD-10-CM

## 2018-12-31 DIAGNOSIS — R262 Difficulty in walking, not elsewhere classified: Secondary | ICD-10-CM

## 2018-12-31 DIAGNOSIS — M6281 Muscle weakness (generalized): Secondary | ICD-10-CM
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
## 2019-01-01 ENCOUNTER — Other Ambulatory Visit: Payer: Self-pay

## 2019-01-01 ENCOUNTER — Ambulatory Visit: Payer: Self-pay

## 2019-01-01 DIAGNOSIS — M25561 Pain in right knee: Secondary | ICD-10-CM

## 2019-01-01 DIAGNOSIS — M6281 Muscle weakness (generalized): Secondary | ICD-10-CM

## 2019-01-01 DIAGNOSIS — M25661 Stiffness of right knee, not elsewhere classified: Secondary | ICD-10-CM

## 2019-01-01 DIAGNOSIS — R262 Difficulty in walking, not elsewhere classified: Secondary | ICD-10-CM
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
## 2019-01-03 ENCOUNTER — Ambulatory Visit: Payer: Self-pay | Admitting: Physical Therapy

## 2019-01-03 ENCOUNTER — Encounter: Payer: Self-pay | Admitting: Physical Therapy

## 2019-01-03 ENCOUNTER — Other Ambulatory Visit: Payer: Self-pay

## 2019-01-03 DIAGNOSIS — M25561 Pain in right knee: Secondary | ICD-10-CM

## 2019-01-03 DIAGNOSIS — R262 Difficulty in walking, not elsewhere classified: Secondary | ICD-10-CM

## 2019-01-03 DIAGNOSIS — M6281 Muscle weakness (generalized): Secondary | ICD-10-CM

## 2019-01-03 DIAGNOSIS — M25661 Stiffness of right knee, not elsewhere classified: Secondary | ICD-10-CM
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
## 2019-01-06 ENCOUNTER — Other Ambulatory Visit: Payer: Self-pay

## 2019-01-06 ENCOUNTER — Ambulatory Visit: Payer: Self-pay | Admitting: Physical Therapy

## 2019-01-06 ENCOUNTER — Encounter: Payer: Self-pay | Admitting: Physical Therapy

## 2019-01-06 DIAGNOSIS — R262 Difficulty in walking, not elsewhere classified: Secondary | ICD-10-CM

## 2019-01-06 DIAGNOSIS — M25661 Stiffness of right knee, not elsewhere classified: Secondary | ICD-10-CM

## 2019-01-06 DIAGNOSIS — M6281 Muscle weakness (generalized): Secondary | ICD-10-CM

## 2019-01-06 DIAGNOSIS — M25561 Pain in right knee: Secondary | ICD-10-CM
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
## 2019-01-09 ENCOUNTER — Ambulatory Visit: Payer: Self-pay | Admitting: Physical Therapy

## 2019-01-09 DIAGNOSIS — M24661 Ankylosis, right knee: Secondary | ICD-10-CM

## 2019-01-13 ENCOUNTER — Ambulatory Visit: Payer: Self-pay

## 2019-01-13 ENCOUNTER — Other Ambulatory Visit: Payer: Self-pay

## 2019-01-13 DIAGNOSIS — M25561 Pain in right knee: Secondary | ICD-10-CM

## 2019-01-13 DIAGNOSIS — M6281 Muscle weakness (generalized): Secondary | ICD-10-CM

## 2019-01-13 DIAGNOSIS — R262 Difficulty in walking, not elsewhere classified: Secondary | ICD-10-CM

## 2019-01-13 DIAGNOSIS — M25661 Stiffness of right knee, not elsewhere classified: Secondary | ICD-10-CM
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
## 2019-01-15 ENCOUNTER — Ambulatory Visit: Payer: Self-pay | Admitting: Physical Therapy

## 2019-01-15 ENCOUNTER — Encounter: Payer: Self-pay | Admitting: Physical Therapy

## 2019-01-15 ENCOUNTER — Other Ambulatory Visit: Payer: Self-pay

## 2019-01-15 DIAGNOSIS — M6281 Muscle weakness (generalized): Secondary | ICD-10-CM

## 2019-01-15 DIAGNOSIS — M25661 Stiffness of right knee, not elsewhere classified: Secondary | ICD-10-CM

## 2019-01-15 DIAGNOSIS — M25561 Pain in right knee: Secondary | ICD-10-CM

## 2019-01-15 DIAGNOSIS — R262 Difficulty in walking, not elsewhere classified: Secondary | ICD-10-CM
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
## 2019-01-16 ENCOUNTER — Ambulatory Visit: Payer: Self-pay | Attending: Student

## 2019-01-16 ENCOUNTER — Other Ambulatory Visit: Payer: Self-pay

## 2019-01-16 DIAGNOSIS — M25661 Stiffness of right knee, not elsewhere classified: Secondary | ICD-10-CM | POA: Insufficient documentation

## 2019-01-16 DIAGNOSIS — M25561 Pain in right knee: Secondary | ICD-10-CM | POA: Insufficient documentation

## 2019-01-16 DIAGNOSIS — M6281 Muscle weakness (generalized): Secondary | ICD-10-CM | POA: Insufficient documentation

## 2019-01-16 DIAGNOSIS — R262 Difficulty in walking, not elsewhere classified: Secondary | ICD-10-CM | POA: Insufficient documentation
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
## 2019-01-20 ENCOUNTER — Other Ambulatory Visit: Payer: Self-pay

## 2019-01-20 ENCOUNTER — Ambulatory Visit: Payer: Self-pay

## 2019-01-20 DIAGNOSIS — R262 Difficulty in walking, not elsewhere classified: Secondary | ICD-10-CM

## 2019-01-20 DIAGNOSIS — M25661 Stiffness of right knee, not elsewhere classified: Secondary | ICD-10-CM

## 2019-01-20 DIAGNOSIS — M6281 Muscle weakness (generalized): Secondary | ICD-10-CM

## 2019-01-20 DIAGNOSIS — M25561 Pain in right knee: Secondary | ICD-10-CM
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
## 2019-01-22 ENCOUNTER — Other Ambulatory Visit: Payer: Self-pay

## 2019-01-22 ENCOUNTER — Ambulatory Visit: Payer: Self-pay

## 2019-01-22 DIAGNOSIS — M25661 Stiffness of right knee, not elsewhere classified: Secondary | ICD-10-CM

## 2019-01-22 DIAGNOSIS — M6281 Muscle weakness (generalized): Secondary | ICD-10-CM

## 2019-01-22 DIAGNOSIS — R262 Difficulty in walking, not elsewhere classified: Secondary | ICD-10-CM

## 2019-01-22 DIAGNOSIS — M25561 Pain in right knee: Secondary | ICD-10-CM
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
## 2019-01-23 ENCOUNTER — Ambulatory Visit: Payer: Self-pay

## 2019-01-23 DIAGNOSIS — M6281 Muscle weakness (generalized): Secondary | ICD-10-CM

## 2019-01-23 DIAGNOSIS — M25661 Stiffness of right knee, not elsewhere classified: Secondary | ICD-10-CM

## 2019-01-23 DIAGNOSIS — M25561 Pain in right knee: Secondary | ICD-10-CM

## 2019-01-23 DIAGNOSIS — R262 Difficulty in walking, not elsewhere classified: Secondary | ICD-10-CM
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
## 2019-01-27 ENCOUNTER — Other Ambulatory Visit: Payer: Self-pay

## 2019-01-27 ENCOUNTER — Ambulatory Visit: Payer: Self-pay | Admitting: Physical Therapy

## 2019-01-27 ENCOUNTER — Encounter: Payer: Self-pay | Admitting: Physical Therapy

## 2019-01-27 DIAGNOSIS — M25561 Pain in right knee: Secondary | ICD-10-CM

## 2019-01-27 DIAGNOSIS — M25661 Stiffness of right knee, not elsewhere classified: Secondary | ICD-10-CM

## 2019-01-27 DIAGNOSIS — R262 Difficulty in walking, not elsewhere classified: Secondary | ICD-10-CM

## 2019-01-27 DIAGNOSIS — M6281 Muscle weakness (generalized): Secondary | ICD-10-CM
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
## 2019-01-30 ENCOUNTER — Other Ambulatory Visit: Payer: Self-pay

## 2019-01-30 ENCOUNTER — Encounter: Payer: Self-pay | Admitting: Physical Therapy

## 2019-01-30 ENCOUNTER — Ambulatory Visit: Payer: Self-pay | Admitting: Physical Therapy

## 2019-01-30 DIAGNOSIS — M25661 Stiffness of right knee, not elsewhere classified: Secondary | ICD-10-CM

## 2019-01-30 DIAGNOSIS — R262 Difficulty in walking, not elsewhere classified: Secondary | ICD-10-CM

## 2019-01-30 DIAGNOSIS — M6281 Muscle weakness (generalized): Secondary | ICD-10-CM

## 2019-01-30 DIAGNOSIS — M25561 Pain in right knee: Secondary | ICD-10-CM
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
## 2019-02-03 ENCOUNTER — Ambulatory Visit: Payer: Self-pay | Admitting: Physical Therapy

## 2019-02-03 ENCOUNTER — Other Ambulatory Visit: Payer: Self-pay

## 2019-02-03 ENCOUNTER — Encounter: Payer: Self-pay | Admitting: Physical Therapy

## 2019-02-03 DIAGNOSIS — R262 Difficulty in walking, not elsewhere classified: Secondary | ICD-10-CM

## 2019-02-03 DIAGNOSIS — M6281 Muscle weakness (generalized): Secondary | ICD-10-CM

## 2019-02-03 DIAGNOSIS — M25661 Stiffness of right knee, not elsewhere classified: Secondary | ICD-10-CM

## 2019-02-03 DIAGNOSIS — M25561 Pain in right knee: Secondary | ICD-10-CM
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
## 2019-02-05 ENCOUNTER — Other Ambulatory Visit: Payer: Self-pay

## 2019-02-05 ENCOUNTER — Ambulatory Visit: Payer: Self-pay

## 2019-02-05 DIAGNOSIS — M25661 Stiffness of right knee, not elsewhere classified: Secondary | ICD-10-CM

## 2019-02-05 DIAGNOSIS — M25561 Pain in right knee: Secondary | ICD-10-CM

## 2019-02-05 DIAGNOSIS — R262 Difficulty in walking, not elsewhere classified: Secondary | ICD-10-CM

## 2019-02-05 DIAGNOSIS — M6281 Muscle weakness (generalized): Secondary | ICD-10-CM
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
## 2019-02-05 NOTE — Patient Instructions (Signed)

## 2019-02-06 ENCOUNTER — Other Ambulatory Visit: Payer: Self-pay

## 2019-02-06 ENCOUNTER — Ambulatory Visit: Payer: Self-pay

## 2019-02-06 DIAGNOSIS — M25561 Pain in right knee: Secondary | ICD-10-CM

## 2019-02-06 DIAGNOSIS — R262 Difficulty in walking, not elsewhere classified: Secondary | ICD-10-CM

## 2019-02-06 DIAGNOSIS — M25661 Stiffness of right knee, not elsewhere classified: Secondary | ICD-10-CM

## 2019-02-06 DIAGNOSIS — M6281 Muscle weakness (generalized): Secondary | ICD-10-CM
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
## 2019-02-10 ENCOUNTER — Ambulatory Visit: Payer: Self-pay

## 2019-02-10 ENCOUNTER — Other Ambulatory Visit: Payer: Self-pay

## 2019-02-10 DIAGNOSIS — R262 Difficulty in walking, not elsewhere classified: Secondary | ICD-10-CM

## 2019-02-10 DIAGNOSIS — M25561 Pain in right knee: Secondary | ICD-10-CM

## 2019-02-10 DIAGNOSIS — M6281 Muscle weakness (generalized): Secondary | ICD-10-CM

## 2019-02-10 DIAGNOSIS — M25661 Stiffness of right knee, not elsewhere classified: Secondary | ICD-10-CM
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
## 2019-02-11 NOTE — Addendum Note (Signed)
Addended by: Janene Harvey D on: 02/11/2019 08:15 AM   Modules accepted: Orders

## 2019-02-12 ENCOUNTER — Ambulatory Visit: Payer: Self-pay

## 2019-02-17 ENCOUNTER — Ambulatory Visit: Payer: Self-pay | Admitting: Physical Therapy

## 2019-02-26 ENCOUNTER — Ambulatory Visit: Payer: Self-pay | Attending: Student | Admitting: Physical Therapy

## 2019-02-26 ENCOUNTER — Other Ambulatory Visit: Payer: Self-pay

## 2019-02-26 ENCOUNTER — Encounter: Payer: Self-pay | Admitting: Physical Therapy

## 2019-02-26 DIAGNOSIS — M6281 Muscle weakness (generalized): Secondary | ICD-10-CM

## 2019-02-26 DIAGNOSIS — R262 Difficulty in walking, not elsewhere classified: Secondary | ICD-10-CM

## 2019-02-26 DIAGNOSIS — M25561 Pain in right knee: Secondary | ICD-10-CM

## 2019-02-26 DIAGNOSIS — M25661 Stiffness of right knee, not elsewhere classified: Secondary | ICD-10-CM
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
## 2019-02-28 ENCOUNTER — Encounter: Payer: Self-pay | Admitting: Physical Therapy

## 2019-02-28 ENCOUNTER — Other Ambulatory Visit: Payer: Self-pay

## 2019-02-28 ENCOUNTER — Ambulatory Visit: Payer: Self-pay | Admitting: Physical Therapy

## 2019-02-28 DIAGNOSIS — M6281 Muscle weakness (generalized): Secondary | ICD-10-CM

## 2019-02-28 DIAGNOSIS — M25561 Pain in right knee: Secondary | ICD-10-CM

## 2019-02-28 DIAGNOSIS — R262 Difficulty in walking, not elsewhere classified: Secondary | ICD-10-CM

## 2019-02-28 DIAGNOSIS — M25661 Stiffness of right knee, not elsewhere classified: Secondary | ICD-10-CM
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
## 2019-03-03 ENCOUNTER — Ambulatory Visit: Payer: Self-pay

## 2019-03-03 ENCOUNTER — Other Ambulatory Visit: Payer: Self-pay

## 2019-03-03 DIAGNOSIS — M6281 Muscle weakness (generalized): Secondary | ICD-10-CM

## 2019-03-03 DIAGNOSIS — M25561 Pain in right knee: Secondary | ICD-10-CM

## 2019-03-03 DIAGNOSIS — M25661 Stiffness of right knee, not elsewhere classified: Secondary | ICD-10-CM

## 2019-03-03 DIAGNOSIS — R262 Difficulty in walking, not elsewhere classified: Secondary | ICD-10-CM
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
## 2019-03-05 ENCOUNTER — Ambulatory Visit: Payer: Self-pay | Admitting: Physical Therapy

## 2019-03-05 ENCOUNTER — Other Ambulatory Visit: Payer: Self-pay

## 2019-03-05 ENCOUNTER — Encounter: Payer: Self-pay | Admitting: Physical Therapy

## 2019-03-05 DIAGNOSIS — M6281 Muscle weakness (generalized): Secondary | ICD-10-CM

## 2019-03-05 DIAGNOSIS — R262 Difficulty in walking, not elsewhere classified: Secondary | ICD-10-CM

## 2019-03-05 DIAGNOSIS — M25561 Pain in right knee: Secondary | ICD-10-CM

## 2019-03-05 DIAGNOSIS — M25661 Stiffness of right knee, not elsewhere classified: Secondary | ICD-10-CM
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
## 2019-03-11 ENCOUNTER — Ambulatory Visit: Payer: Self-pay

## 2019-03-17 ENCOUNTER — Ambulatory Visit: Payer: Self-pay | Admitting: Physical Therapy

## 2019-03-19 ENCOUNTER — Ambulatory Visit: Payer: Self-pay | Attending: Student

## 2019-03-19 ENCOUNTER — Other Ambulatory Visit: Payer: Self-pay

## 2019-03-19 DIAGNOSIS — M6281 Muscle weakness (generalized): Secondary | ICD-10-CM | POA: Insufficient documentation

## 2019-03-19 DIAGNOSIS — R262 Difficulty in walking, not elsewhere classified: Secondary | ICD-10-CM | POA: Insufficient documentation

## 2019-03-19 DIAGNOSIS — M25661 Stiffness of right knee, not elsewhere classified: Secondary | ICD-10-CM | POA: Insufficient documentation

## 2019-03-19 DIAGNOSIS — M25561 Pain in right knee: Secondary | ICD-10-CM | POA: Insufficient documentation
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
## 2019-03-25 ENCOUNTER — Ambulatory Visit: Payer: Self-pay

## 2019-03-25 ENCOUNTER — Other Ambulatory Visit: Payer: Self-pay

## 2019-03-25 DIAGNOSIS — M25561 Pain in right knee: Secondary | ICD-10-CM

## 2019-03-25 DIAGNOSIS — M6281 Muscle weakness (generalized): Secondary | ICD-10-CM

## 2019-03-25 DIAGNOSIS — M25661 Stiffness of right knee, not elsewhere classified: Secondary | ICD-10-CM

## 2019-03-25 DIAGNOSIS — R262 Difficulty in walking, not elsewhere classified: Secondary | ICD-10-CM
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
## 2019-03-26 ENCOUNTER — Ambulatory Visit: Payer: Self-pay | Admitting: Physical Therapy

## 2019-03-31 ENCOUNTER — Ambulatory Visit: Payer: Self-pay

## 2019-04-01 ENCOUNTER — Encounter: Payer: Self-pay | Admitting: Physical Therapy

## 2019-04-07 ENCOUNTER — Ambulatory Visit: Payer: Self-pay

## 2019-04-07 ENCOUNTER — Other Ambulatory Visit: Payer: Self-pay

## 2019-04-07 DIAGNOSIS — R262 Difficulty in walking, not elsewhere classified: Secondary | ICD-10-CM

## 2019-04-07 DIAGNOSIS — M25561 Pain in right knee: Secondary | ICD-10-CM

## 2019-04-07 DIAGNOSIS — M6281 Muscle weakness (generalized): Secondary | ICD-10-CM

## 2019-04-07 DIAGNOSIS — M25661 Stiffness of right knee, not elsewhere classified: Secondary | ICD-10-CM
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
## 2019-04-08 ENCOUNTER — Ambulatory Visit: Payer: Self-pay | Admitting: Physical Therapy

## 2019-04-08 ENCOUNTER — Encounter: Payer: Self-pay | Admitting: Physical Therapy

## 2019-04-08 DIAGNOSIS — R262 Difficulty in walking, not elsewhere classified: Secondary | ICD-10-CM

## 2019-04-08 DIAGNOSIS — M25661 Stiffness of right knee, not elsewhere classified: Secondary | ICD-10-CM

## 2019-04-08 DIAGNOSIS — M6281 Muscle weakness (generalized): Secondary | ICD-10-CM

## 2019-04-08 DIAGNOSIS — M25561 Pain in right knee: Secondary | ICD-10-CM
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
## 2019-04-21 ENCOUNTER — Ambulatory Visit: Payer: Self-pay | Attending: Student | Admitting: Physical Therapy

## 2019-04-21 ENCOUNTER — Other Ambulatory Visit: Payer: Self-pay

## 2019-04-21 ENCOUNTER — Encounter: Payer: Self-pay | Admitting: Physical Therapy

## 2019-04-21 DIAGNOSIS — M6281 Muscle weakness (generalized): Secondary | ICD-10-CM

## 2019-04-21 DIAGNOSIS — M25661 Stiffness of right knee, not elsewhere classified: Secondary | ICD-10-CM

## 2019-04-21 DIAGNOSIS — M25561 Pain in right knee: Secondary | ICD-10-CM

## 2019-04-21 DIAGNOSIS — R262 Difficulty in walking, not elsewhere classified: Secondary | ICD-10-CM

## 2019-04-21 NOTE — Patient Instructions (Signed)
   Kinesiology tape  What is kinesiology tape?  There are many brands of kinesiology tape. KTape, Rock Tape, Body Sport, Dynamic tape, to name a few.  It is an elasticized tape designed to support the body's natural healing process. This tape provides stability and support to muscles and joints without restricting motion.  It can also help decrease swelling in the area of application.  How does it work?  The tape microscopically lifts and decompresses the skin to allow for drainage of lymph (swelling) to flow away from area, reducing inflammation. The tape has the ability to help re-educate the neuromuscular system by targeting specific receptors in the skin. The presence of the tape increases the body's awareness of posture and body mechanics.  Do not use with:  . Open wounds . Skin lesions . Adhesive allergies  In some rare cases, mild/moderate skin irritation can occur. This can include redness, itchiness, or hives. If this occurs, immediately remove tape and consult your primary care physician if symptoms are severe or do not resolve within 2 days.  Safe removal of the tape:  To remove tape safely, hold nearby skin with one hand and gentle roll tape down with other hand. You can apply oil or conditioner to tape while in shower prior to removal to loosen adhesive. DO NOT swiftly rip tape off like a band-aid, as this could cause skin tears and additional skin irritation.     For questions, please contact your therapist at:  [facility] [place] [address]  [address] [place], [place], [address] Phone: [phone]   Fax:  [phone]     
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
## 2019-04-29 ENCOUNTER — Encounter: Payer: Self-pay | Admitting: Physical Therapy

## 2019-04-29 ENCOUNTER — Other Ambulatory Visit: Payer: Self-pay

## 2019-04-29 ENCOUNTER — Ambulatory Visit: Payer: Self-pay | Admitting: Physical Therapy

## 2019-04-29 DIAGNOSIS — M25661 Stiffness of right knee, not elsewhere classified: Secondary | ICD-10-CM

## 2019-04-29 DIAGNOSIS — R262 Difficulty in walking, not elsewhere classified: Secondary | ICD-10-CM

## 2019-04-29 DIAGNOSIS — M25561 Pain in right knee: Secondary | ICD-10-CM

## 2019-04-29 DIAGNOSIS — M6281 Muscle weakness (generalized): Secondary | ICD-10-CM
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
## 2019-05-06 ENCOUNTER — Ambulatory Visit: Payer: Self-pay | Admitting: Physical Therapy

## 2019-05-06 ENCOUNTER — Other Ambulatory Visit: Payer: Self-pay

## 2019-05-06 ENCOUNTER — Encounter: Payer: Self-pay | Admitting: Physical Therapy

## 2019-05-06 DIAGNOSIS — R262 Difficulty in walking, not elsewhere classified: Secondary | ICD-10-CM

## 2019-05-06 DIAGNOSIS — M25561 Pain in right knee: Secondary | ICD-10-CM

## 2019-05-06 DIAGNOSIS — M25661 Stiffness of right knee, not elsewhere classified: Secondary | ICD-10-CM

## 2019-05-06 DIAGNOSIS — M6281 Muscle weakness (generalized): Secondary | ICD-10-CM
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
## 2019-05-08 ENCOUNTER — Ambulatory Visit: Payer: Self-pay

## 2019-05-13 ENCOUNTER — Ambulatory Visit: Payer: Self-pay

## 2019-05-13 ENCOUNTER — Other Ambulatory Visit: Payer: Self-pay

## 2019-05-13 DIAGNOSIS — M25661 Stiffness of right knee, not elsewhere classified: Secondary | ICD-10-CM

## 2019-05-13 DIAGNOSIS — R262 Difficulty in walking, not elsewhere classified: Secondary | ICD-10-CM

## 2019-05-13 DIAGNOSIS — M25561 Pain in right knee: Secondary | ICD-10-CM

## 2019-05-13 DIAGNOSIS — M6281 Muscle weakness (generalized): Secondary | ICD-10-CM
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
## 2019-05-15 ENCOUNTER — Encounter: Payer: Self-pay | Admitting: Physical Therapy

## 2019-05-15 ENCOUNTER — Ambulatory Visit: Payer: Self-pay | Admitting: Physical Therapy

## 2019-05-15 ENCOUNTER — Other Ambulatory Visit: Payer: Self-pay

## 2019-05-15 DIAGNOSIS — M25561 Pain in right knee: Secondary | ICD-10-CM

## 2019-05-15 DIAGNOSIS — M25661 Stiffness of right knee, not elsewhere classified: Secondary | ICD-10-CM

## 2019-05-15 DIAGNOSIS — M6281 Muscle weakness (generalized): Secondary | ICD-10-CM

## 2019-05-15 DIAGNOSIS — R262 Difficulty in walking, not elsewhere classified: Secondary | ICD-10-CM
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
## 2019-05-20 ENCOUNTER — Other Ambulatory Visit: Payer: Self-pay

## 2019-05-20 ENCOUNTER — Ambulatory Visit: Payer: Self-pay | Attending: Student

## 2019-05-20 DIAGNOSIS — M25561 Pain in right knee: Secondary | ICD-10-CM | POA: Insufficient documentation

## 2019-05-20 DIAGNOSIS — M25661 Stiffness of right knee, not elsewhere classified: Secondary | ICD-10-CM | POA: Insufficient documentation

## 2019-05-20 DIAGNOSIS — M6281 Muscle weakness (generalized): Secondary | ICD-10-CM | POA: Insufficient documentation

## 2019-05-20 DIAGNOSIS — R262 Difficulty in walking, not elsewhere classified: Secondary | ICD-10-CM | POA: Insufficient documentation
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
## 2019-05-22 ENCOUNTER — Ambulatory Visit: Payer: Self-pay | Admitting: Physical Therapy

## 2019-05-27 ENCOUNTER — Other Ambulatory Visit: Payer: Self-pay

## 2019-05-27 ENCOUNTER — Ambulatory Visit: Payer: Self-pay

## 2019-05-27 DIAGNOSIS — M25661 Stiffness of right knee, not elsewhere classified: Secondary | ICD-10-CM

## 2019-05-27 DIAGNOSIS — M6281 Muscle weakness (generalized): Secondary | ICD-10-CM

## 2019-05-27 DIAGNOSIS — R262 Difficulty in walking, not elsewhere classified: Secondary | ICD-10-CM

## 2019-05-27 DIAGNOSIS — M25561 Pain in right knee: Secondary | ICD-10-CM
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
## 2019-05-29 ENCOUNTER — Ambulatory Visit: Payer: Self-pay | Admitting: Physical Therapy

## 2021-08-08 IMAGING — RF DG KNEE COMPLETE 4+V*R*
1 series · 4 of 4 positions shown · non-contrast
Comparison: Knee radiograph 10/21/2018

CLINICAL DATA: Patient status post closed manipulation.

EXAM:
RIGHT KNEE - COMPLETE 4+ VIEW; DG C-ARM 1-60 MIN

[Series 1: run · 4 of 4 slices shown]
[im 1/4]
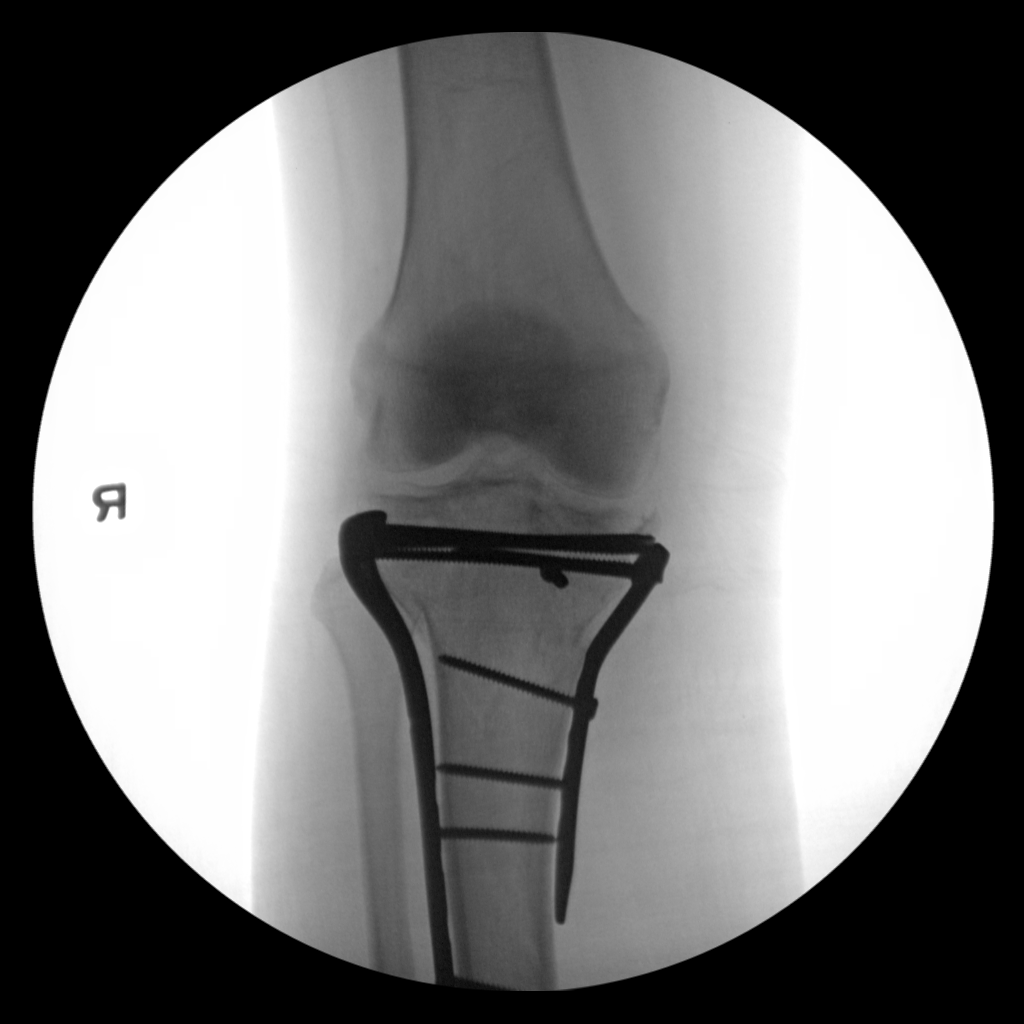
[im 2/4]
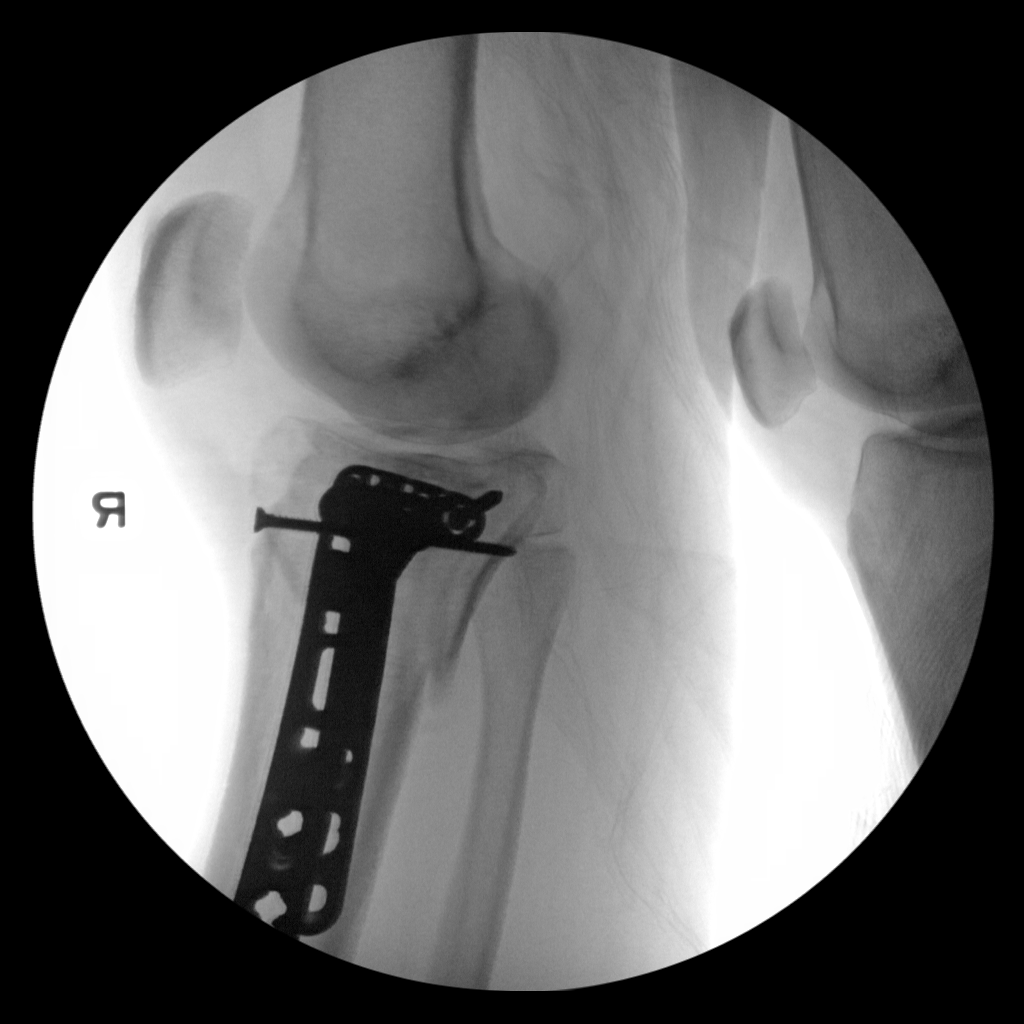
[im 3/4]
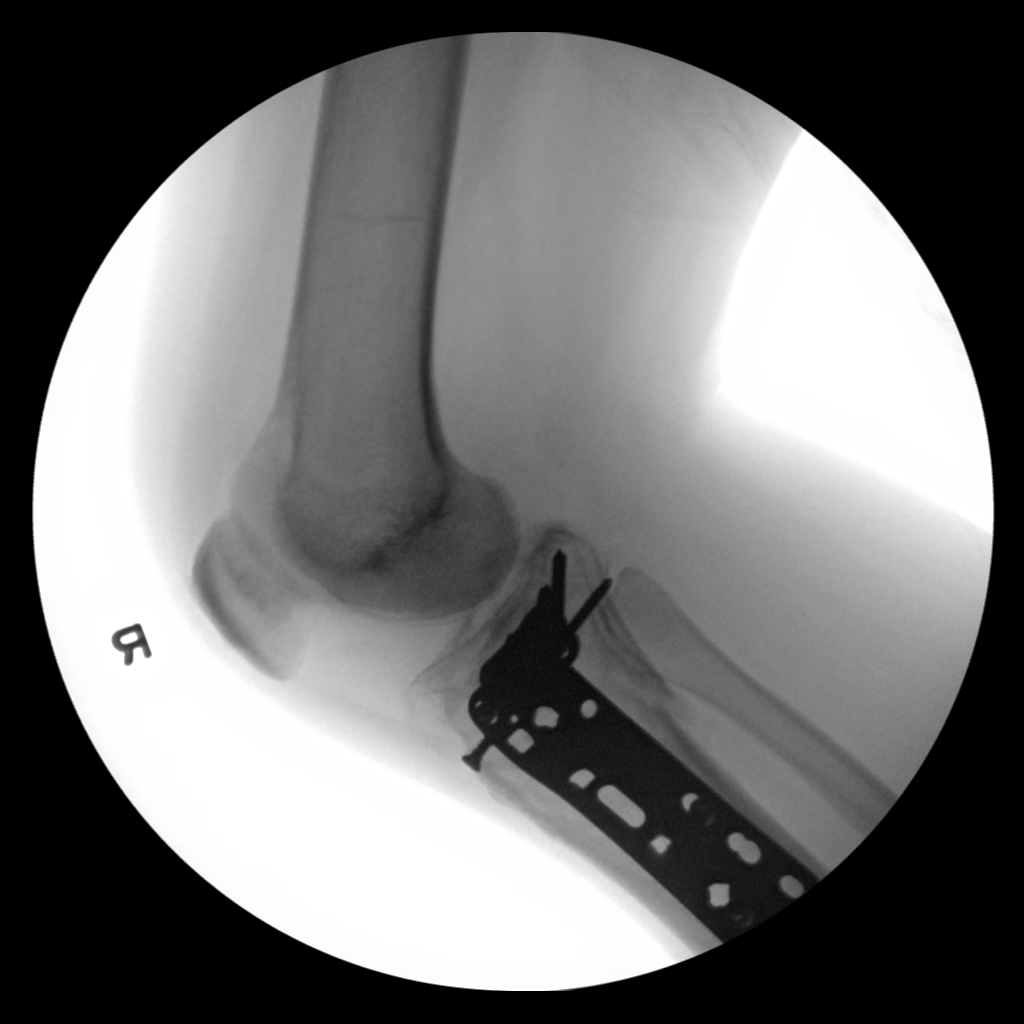
[im 4/4]
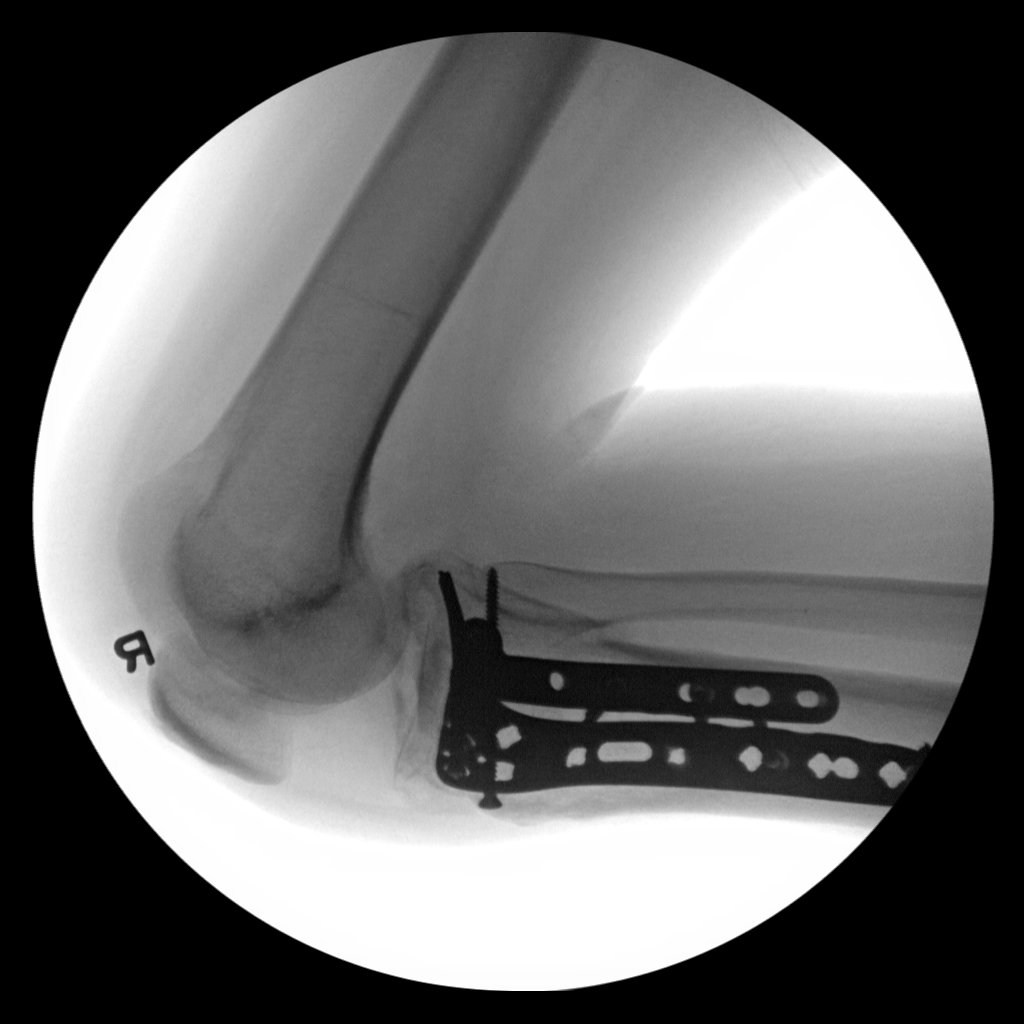

[4 of 4 positions shown; findings below may reference images not displayed]

FINDINGS: Single portable radiograph demonstrates proximal tibial hardware.
Incompletely visualized fracture lines of the proximal tibia.
Overlying soft tissues unremarkable.
IMPRESSION: Redemonstrated proximal tibial hardware.

## 2021-08-08 IMAGING — DX DG KNEE 1-2V PORT*R*
2 series · 2 of 2 positions shown · non-contrast
Comparison: Intraop films from earlier in the same day.

CLINICAL DATA: Status post closed manipulation of tibial plateau
fracture

EXAM:
PORTABLE RIGHT KNEE - 2 VIEW

[knee ap]
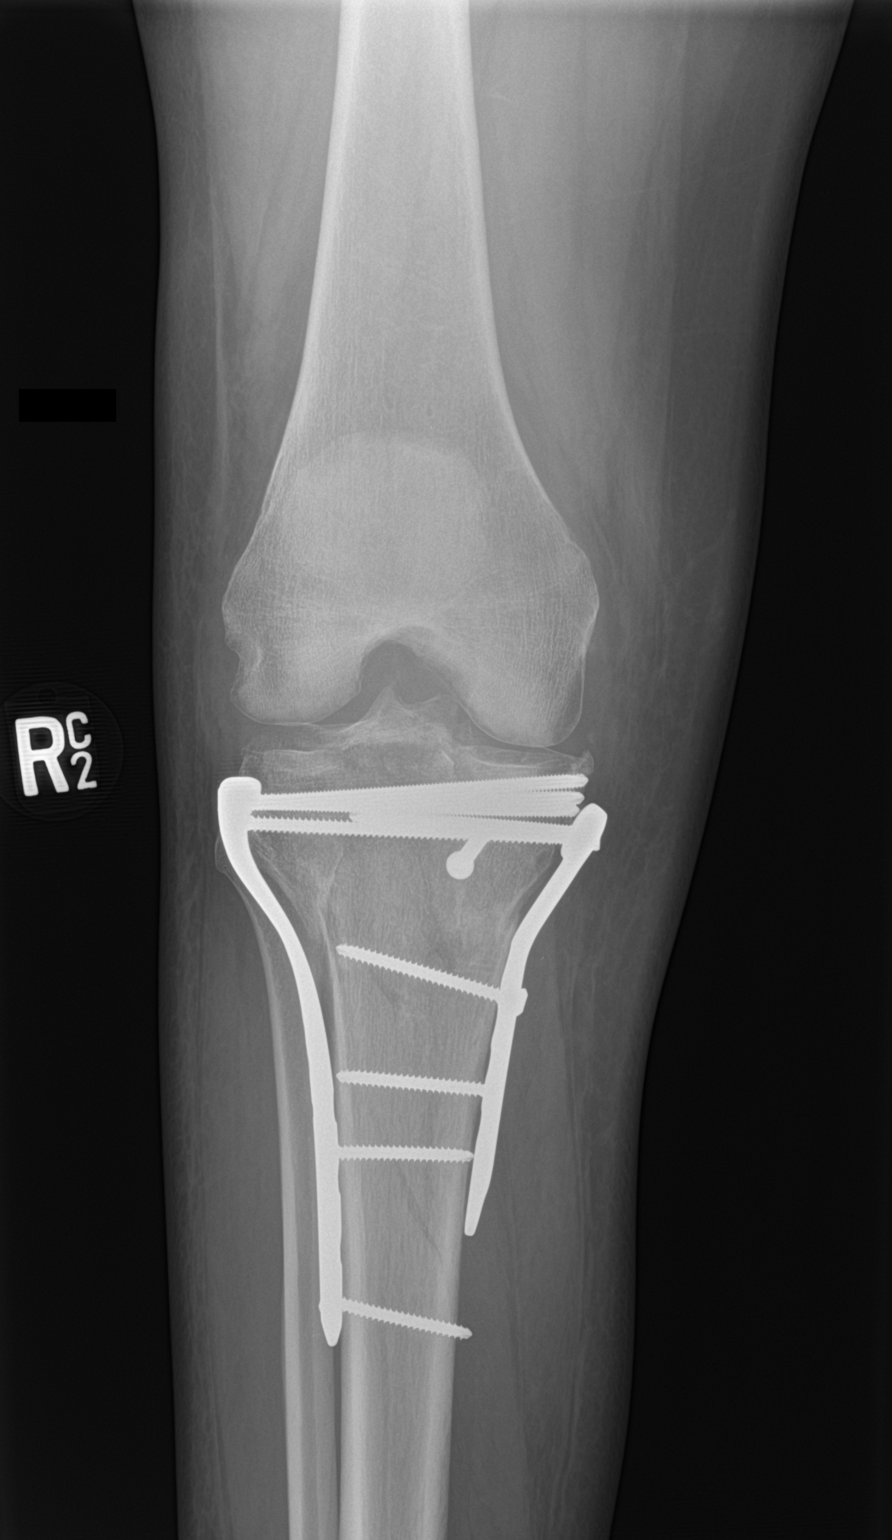

[knee lat]
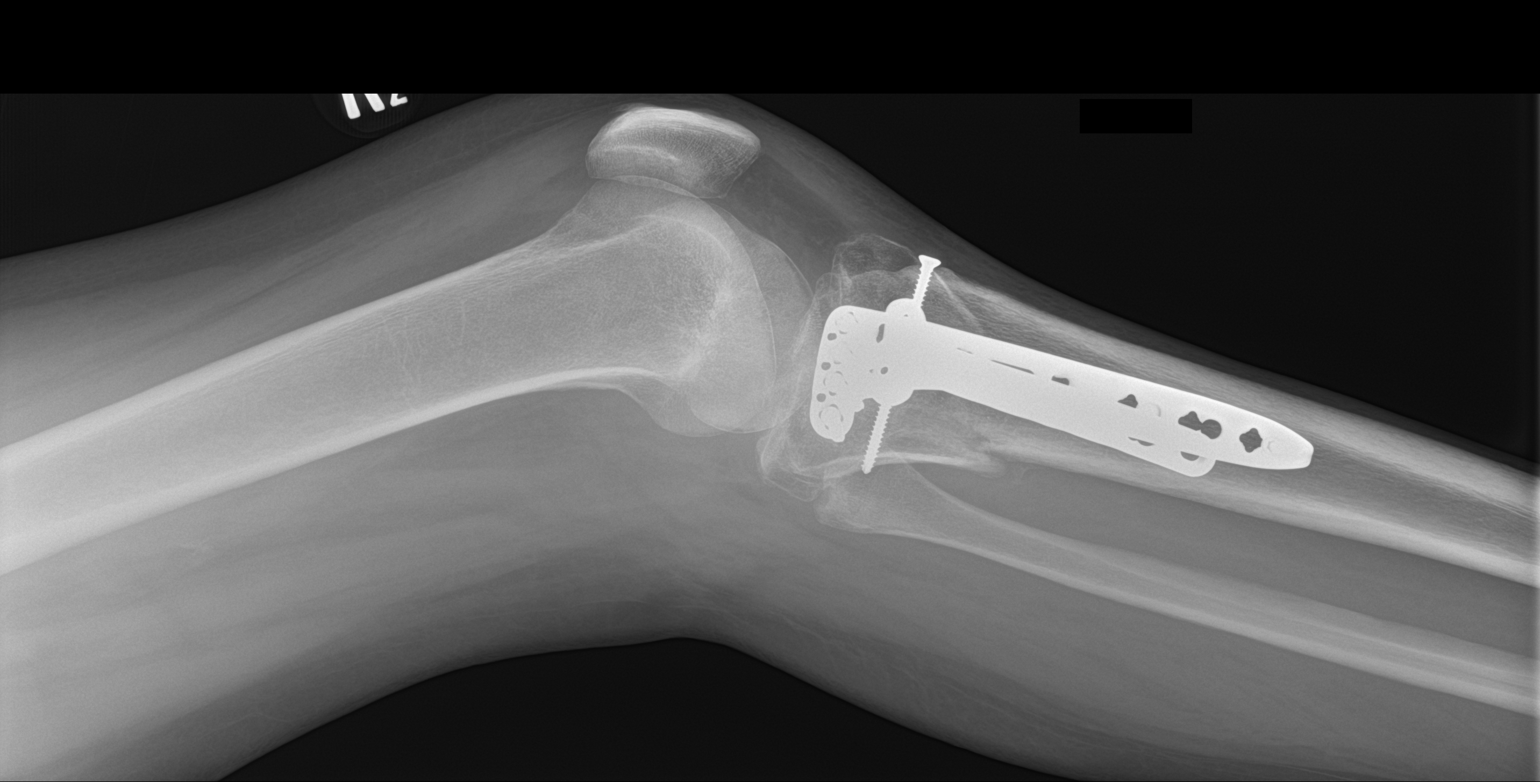

[2 of 2 positions shown; findings below may reference images not displayed]

FINDINGS: Fixation side plates are noted along tibia both medially and
laterally with multiple fixation screws. Previously seen tibial
plateau fracture extending into the metaphysis is noted. No new
abnormality is noted. No joint effusion is seen.
IMPRESSION: Known right tibial plateau fracture with fixation plates and screws.
